# Patient Record
Sex: Male | Born: 1960 | Race: White | Hispanic: No | Marital: Single | Smoking: Never smoker
Health system: Southern US, Community
[De-identification: ages and names within clinical notes are randomized; demographics above are authoritative.]

## PROBLEM LIST (undated history)

## (undated) VITALS — BP 131/80 | HR 79 | Temp 98.0°F | Resp 20 | Ht 70.0 in | Wt 172.0 lb

## (undated) DIAGNOSIS — F32A Depression, unspecified: Secondary | ICD-10-CM

## (undated) DIAGNOSIS — F329 Major depressive disorder, single episode, unspecified: Secondary | ICD-10-CM

## (undated) DIAGNOSIS — M1990 Unspecified osteoarthritis, unspecified site: Secondary | ICD-10-CM

## (undated) HISTORY — DX: Unspecified osteoarthritis, unspecified site: M19.90

## (undated) HISTORY — DX: Depression, unspecified: F32.A

---
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
## 2000-09-20 ENCOUNTER — Emergency Department (HOSPITAL_COMMUNITY): Admission: EM | Admit: 2000-09-20 | Discharge: 2000-09-20 | Payer: Self-pay | Admitting: Internal Medicine

## 2008-03-03 ENCOUNTER — Ambulatory Visit: Payer: Self-pay | Admitting: Sports Medicine

## 2008-03-03 DIAGNOSIS — M545 Low back pain, unspecified: Secondary | ICD-10-CM | POA: Insufficient documentation

## 2008-03-03 DIAGNOSIS — M2020 Hallux rigidus, unspecified foot: Secondary | ICD-10-CM | POA: Insufficient documentation

## 2008-05-01 ENCOUNTER — Ambulatory Visit: Payer: Self-pay | Admitting: Sports Medicine

## 2008-05-15 ENCOUNTER — Ambulatory Visit: Payer: Self-pay | Admitting: Sports Medicine

## 2008-05-17 ENCOUNTER — Ambulatory Visit: Payer: Self-pay | Admitting: Family Medicine

## 2008-05-17 ENCOUNTER — Encounter: Payer: Self-pay | Admitting: Sports Medicine

## 2008-05-17 LAB — CONVERTED CEMR LAB
HDL: 62 mg/dL (ref >39)
LDL Cholesterol: 78 mg/dL (ref 0–99)
Total CHOL/HDL Ratio: 2.4
VLDL: 8 mg/dL (ref 0–40)

## 2012-01-29 ENCOUNTER — Ambulatory Visit (INDEPENDENT_AMBULATORY_CARE_PROVIDER_SITE_OTHER): Payer: BC Managed Care – PPO | Admitting: Family Medicine

## 2012-01-29 ENCOUNTER — Encounter: Payer: Self-pay | Admitting: Family Medicine

## 2012-01-29 VITALS — BP 128/73 | HR 57 | Temp 97.7°F | Resp 18 | Ht 70.0 in | Wt 172.0 lb

## 2012-01-29 DIAGNOSIS — Z125 Encounter for screening for malignant neoplasm of prostate: Secondary | ICD-10-CM

## 2012-01-29 DIAGNOSIS — Z0000 Encounter for general adult medical examination without abnormal findings: Secondary | ICD-10-CM

## 2012-01-29 DIAGNOSIS — Z1211 Encounter for screening for malignant neoplasm of colon: Secondary | ICD-10-CM

## 2012-01-29 DIAGNOSIS — Z13220 Encounter for screening for lipoid disorders: Secondary | ICD-10-CM

## 2012-01-29 DIAGNOSIS — Z23 Encounter for immunization: Secondary | ICD-10-CM

## 2012-01-29 LAB — POCT URINALYSIS DIPSTICK
Leukocytes, UA: NEGATIVE
Nitrite, UA: NEGATIVE
Protein, UA: NEGATIVE
Urobilinogen, UA: 0.2
pH, UA: 5

## 2012-01-29 LAB — IFOBT (OCCULT BLOOD): IFOBT: NEGATIVE

## 2012-01-29 NOTE — Patient Instructions (Addendum)
Keeping you healthy  Get these tests  Blood pressure- Have your blood pressure checked once a year by your healthcare provider.  Normal blood pressure is 120/80  Weight- Have your body mass index (BMI) calculated to screen for obesity.  BMI is a measure of body fat based on height and weight. You can also calculate your own BMI at [email or web address].  Cholesterol- Have your cholesterol checked every year.  Diabetes- Have your blood sugar checked regularly if you have high blood pressure, high cholesterol, have a family history of diabetes or if you are overweight.  Screening for Colon Cancer- Colonoscopy starting at age 53.  Screening may begin sooner depending on your family history and other health conditions. Follow up colonoscopy as directed by your Gastroenterologist.  Please collect the stool specimens as directed and return the Hemosures to our facility.  Screening for Prostate Cancer- Both blood work (PSA) and a rectal exam help screen for Prostate Cancer.  Screening begins at age 23 with African-American men and at age 23 with Caucasian men.  Screening may begin sooner depending on your family history.  Take these medicines  Aspirin- One aspirin daily can help prevent Heart disease and Stroke.  Flu shot- Every fall.  Tetanus- Every 10 years.  Today , you received Tdap- this is a 1-time dose.  Zostavax- Once after the age of 53 to prevent Shingles. The recommendation is to get this vaccine at age 53. You are getting an prescription for this to take to a local pharmacy where you will receive the vaccine.  Pneumonia shot- Once after the age of 53; if you are younger than 53, ask your healthcare provider if you need a Pneumonia shot.  Take these steps  Don't smoke- If you do smoke, talk to your doctor about quitting.  For tips on how to quit, go to www.smokefree.gov or call 1-800-QUIT-NOW.  Be physically active- Exercise 5 days a week for at least 30 minutes.  If you are  not already physically active start slow and gradually work up to 30 minutes of moderate physical activity.  Examples of moderate activity include walking briskly, mowing the yard, dancing, swimming, bicycling, etc.  Eat a healthy diet- Eat a variety of healthy food such as fruits, vegetables, low fat milk, low fat cheese, yogurt, lean meant, poultry, fish, beans, tofu, etc. For more information go to www.thenutritionsource.org  Drink alcohol in moderation- Limit alcohol intake to less than two drinks a day. Never drink and drive.  Dentist- Brush and floss twice daily; visit your dentist twice a year.  Depression- Your emotional health is as important as your physical health. If you're feeling down, or losing interest in things you would normally enjoy please talk to your healthcare provider.  Eye exam- Visit your eye doctor every year.  Safe sex- If you may be exposed to a sexually transmitted infection, use a condom.  Seat belts- Seat belts can save your life; always wear one.  Smoke/Carbon Monoxide detectors- These detectors need to be installed on the appropriate level of your home.  Replace batteries at least once a year.  Skin cancer- When out in the sun, cover up and use sunscreen 15 SPF or higher.  Violence- If anyone is threatening you, please tell your healthcare provider.  Living Will/ Health care power of attorney- Speak with your healthcare provider and family.     Testicular Problems and Self-Exam Men can examine themselves easily and effectively with positive results. Monthly exams detect problems early and  save lives. There are numerous causes of swelling in the testicle. Testicular cancer usually appears as a firm painless lump in the front part of the testicle. This may feel like a dull ache or heavy feeling located in the lower abdomen (belly), groin, or scrotum.  The risk is greater in men with undescended testicles and it is more common in young men. It is responsible  for almost a fifth of cancers in males between ages 42 and 25. Other common causes of swellings, lumps, and testicular pain include injuries, inflammation (soreness) from infection, hydrocele, and torsion. These are a few of the reasons to do monthly self-examination of the testicles. The exam only takes minutes and could add years to your life. Get in the habit! SELF-EXAMINATION OF THE TESTICLES The testicles are easiest to examine after warm baths or showers and are more difficult to examine when you are cold. This is because the muscles attached to the testicles retract and pull them up higher or into the abdomen. While standing, roll one testicle between the thumb and forefinger. Feel for lumps, swelling, or discomfort. A normal testicle is egg shaped and feels firm. It is smooth and not tender. The spermatic cord can be felt as a firm spaghetti-like cord at the back of the testicle. It is also important to examine your groins. This is the crease between the front of your leg and your abdomen. Also, feel for enlarged lymph nodes (glands). Enlarged nodes are also a cause for you to see your caregiver for evaluation.  Self-examination of the testicles and groin areas on a regular basis will help you to know what your own testicles and groins feel like. This will help you pick up an abnormality (difference) at an earlier stage. Early discovery is the key to curing this cancer or treating other conditions. Any lump, change, or swelling in the testicle calls for immediate evaluation by your caregiver. Cancer of the testicle does not result in impotence and it does not prevent normal intercourse or prevent having children. If your caregiver feels that medical treatment or chemotherapy could lead to infertility, sperm can be frozen for future use. It is necessary to see a caregiver as soon as possible after the discovery of a lump in a testicle. Document Released: 10/06/2000 Document Revised: 06/19/2011 Document  Reviewed: 07/01/2008 [facility] Patient Information 2012 [place], [place].

## 2012-01-30 LAB — LIPID PANEL
Cholesterol: 159 mg/dL (ref 0–200)
HDL: 79 mg/dL (ref >39)
Total CHOL/HDL Ratio: 2 Ratio
VLDL: 8 mg/dL (ref 0–40)

## 2012-01-30 LAB — COMPREHENSIVE METABOLIC PANEL
ALT: 29 U/L (ref 0–53)
AST: 26 U/L (ref 0–37)
BUN: 25 mg/dL — ABNORMAL HIGH (ref 6–23)
Calcium: 9.9 mg/dL (ref 8.4–10.5)
Chloride: 103 mEq/L (ref 96–112)
Creat: 1.14 mg/dL (ref 0.50–1.35)
Total Bilirubin: 0.7 mg/dL (ref 0.3–1.2)

## 2012-02-02 ENCOUNTER — Encounter: Payer: Self-pay | Admitting: Family Medicine

## 2012-02-02 NOTE — Progress Notes (Signed)
Subjective:    Patient ID: Robert Huff, male    DOB: 03/12/1961, 51 y.o.   MRN: [id number]  HPI  This 51 y.o. Cauc male is new to [facility] - here for CPE and health maintenance guidance.  His only supplement is Fish Oil capsules- 2 per day. He exercises regularly. He is employed as a IT trainer and  has a long term relationship.    Review of Systems Negative     Objective:   Physical Exam  Nursing note and vitals reviewed. Constitutional: He is oriented to person, place, and time. He appears well-developed and well-nourished. No distress.  HENT:  Head: Normocephalic and atraumatic.  Right Ear: Hearing, tympanic membrane, external ear and ear canal normal.  Left Ear: Hearing, tympanic membrane, external ear and ear canal normal.  Nose: Nose normal. No mucosal edema, nasal deformity or septal deviation.  Mouth/Throat: Uvula is midline, oropharynx is clear and moist and mucous membranes are normal. No oral lesions. Normal dentition. No dental caries. No posterior oropharyngeal erythema.  Eyes: Conjunctivae, EOM and lids are normal. Pupils are equal, round, and reactive to light. Right eye exhibits no discharge. Left eye exhibits no discharge. Right conjunctiva has no hemorrhage. Left conjunctiva has no hemorrhage. No scleral icterus.  Fundoscopic exam:      The right eye shows no arteriolar narrowing and no AV nicking. The right eye shows red reflex.      The left eye shows no arteriolar narrowing, no AV nicking and no papilledema. The left eye shows red reflex. Neck: Normal range of motion. Neck supple. No JVD present. No thyromegaly present.  Cardiovascular: Normal rate, regular rhythm, normal heart sounds and intact distal pulses.  Exam reveals no gallop and no friction rub.   No murmur heard. Pulmonary/Chest: Effort normal and breath sounds normal. No respiratory distress. He has no wheezes. He exhibits no tenderness.  Abdominal: Soft. Normal appearance and bowel sounds are normal. He exhibits  no distension, no abdominal bruit, no pulsatile midline mass and no mass. There is no hepatosplenomegaly. There is no tenderness. There is no guarding and no CVA tenderness. No hernia. Hernia confirmed negative in the ventral area, confirmed negative in the right inguinal area and confirmed negative in the left inguinal area.  Genitourinary: Rectum normal, prostate normal, testes normal and penis normal. Rectal exam shows no external hemorrhoid, no fissure, no mass and anal tone normal. Guaiac negative stool. Prostate is not tender. Right testis shows no mass, no swelling and no tenderness. Right testis is descended. Left testis shows no mass, no swelling and no tenderness. Left testis is descended.  Musculoskeletal: Normal range of motion. He exhibits no edema.  Lymphadenopathy:    He has no cervical adenopathy.       Right: No inguinal adenopathy present.       Left: No inguinal adenopathy present.  Neurological: He is alert and oriented to person, place, and time. He has normal reflexes. No cranial nerve deficit. He exhibits normal muscle tone. Coordination normal.  Skin: Skin is warm and dry. No rash noted. No pallor.  Psychiatric: He has a normal mood and affect. His behavior is normal. Judgment and thought content normal.          Assessment & Plan:   1. Routine general medical examination at a health care facility  POCT urinalysis dipstick, IFOBT POC (occult bld, rslt in office), Comprehensive metabolic panel  2. Screening for prostate cancer    3. Screening for hyperlipidemia  Lipid panel  4. Screening for colorectal cancer  IFOBT POC (occult bld, rslt in office)  5. Need for prophylactic vaccination with combined diphtheria-tetanus-pertussis (DTP) vaccine  Tdap vaccine greater than or equal to 7yo IM

## 2012-02-06 ENCOUNTER — Encounter: Payer: Self-pay | Admitting: *Deleted

## 2012-02-06 NOTE — Progress Notes (Signed)
Quick Note:  Please notify pt that results are normal.   Provide pt with copy of labs. ______ 

## 2012-02-10 LAB — IFOBT (OCCULT BLOOD): IFOBT: NEGATIVE

## 2014-01-11 ENCOUNTER — Encounter (INDEPENDENT_AMBULATORY_CARE_PROVIDER_SITE_OTHER): Payer: BC Managed Care – PPO | Admitting: Ophthalmology

## 2015-05-16 ENCOUNTER — Ambulatory Visit: Payer: Self-pay | Admitting: Sports Medicine

## 2015-06-04 ENCOUNTER — Encounter: Payer: Self-pay | Admitting: Family Medicine

## 2015-06-04 ENCOUNTER — Ambulatory Visit (INDEPENDENT_AMBULATORY_CARE_PROVIDER_SITE_OTHER): Payer: Self-pay | Admitting: Family Medicine

## 2015-06-04 VITALS — BP 125/76 | HR 65 | Ht 70.0 in | Wt 170.0 lb

## 2015-06-04 DIAGNOSIS — M25561 Pain in right knee: Secondary | ICD-10-CM

## 2015-06-04 NOTE — Patient Instructions (Addendum)
Your knee pain is most consistent with mild arthritis, less likely a degenerative lateral meniscus tear. Both are treated similarly. I'd avoid deep squats, deep lunges, leg press for now. Consider tylenol 500mg  1-2 tabs three times a day for pain. Ibuprofen 600mg  three times a day OR Aleve 2 tabs twice a day with food for 7-10 days then as needed. Capsaicin, aspercreme, or biofreeze topically up to four times a day may also help with pain. Cortisone injections are an option. It's important that you continue to stay active. Straight leg raises, knee extensions 3 sets of 10 once a day (add ankle weight if these become too easy). Consider physical therapy to strengthen muscles around the joint that hurts to take pressure off of the joint itself. Shoe inserts with good arch support may be helpful. Ice 15 minutes at a time 3-4 times a day as needed to help with pain. Think about dropping your usual exercises that have potential to cause pain to 50% (like rowing) and increase by about 10% per week. Follow up with me in 1 month.

## 2015-06-11 DIAGNOSIS — M25561 Pain in right knee: Secondary | ICD-10-CM | POA: Insufficient documentation

## 2015-06-11 NOTE — Assessment & Plan Note (Signed)
consistent with DJD, less likely degenerative lateral meniscus tear.  Discussed tylenol, nsaids, topical medications.  Consider cortisone injection.  Shown home exercises to do daily.  Arch supports, icing.  Discussed relative rest and slowly increasing workout activities.  F/u in 1 month.

## 2015-06-11 NOTE — Progress Notes (Signed)
PCP: No primary care provider on file.  Subjective:   HPI: Patient is a 54 y.o. male here for right knee pain.  Patient reports he's had a little right knee pain for about 5 months. Came back quickly though 2 months ago when jumping rope (around 10/1). Pain is deep, lateral, sharp. Associated with some weakness of this leg. Worse with getting up from prolonged sitting. Pain 2/10 but up to 6/10 with certain activities. No skin changes, fever, other complaints.  No past medical history on file.  No current outpatient prescriptions on file prior to visit.   No current facility-administered medications on file prior to visit.    No past surgical history on file.  No Known Allergies  Social History   Social History  . Marital Status: Single    Spouse Name: N/A  . Number of Children: N/A  . Years of Education: N/A   Occupational History  . Not on file.   Social History Main Topics  . Smoking status: Never Smoker   . Smokeless tobacco: Not on file  . Alcohol Use: 0.6 - 1.2 oz/week    1-2 Standard drinks or equivalent per week  . Drug Use: No  . Sexual Activity: Yes   Other Topics Concern  . Not on file   Social History Narrative    Family History  Problem Relation Age of Onset  . Cancer Mother     Breast cancer  . Cancer Father     Lung cancer  . Gout Brother   . Heart disease Maternal Grandmother   . Early death Maternal Grandfather   . Gout Brother   . Gout Brother   . Gout Brother     BP 125/76 mmHg  Pulse 65  Ht 5\' 10"  (1.778 m)  Wt 170 lb (77.111 kg)  BMI 24.39 kg/m2  Review of Systems: See HPI above.    Objective:  Physical Exam:  Gen: NAD  Right knee: No gross deformity, ecchymoses, effusion. TTP lateral > medial joint line. FROM. Negative ant/post drawers. Negative valgus/varus testing. Negative lachmanns. Negative mcmurrays, apleys, patellar apprehension. NV intact distally.  Left knee: FROM without pain.    Assessment &  Plan:  1. Right knee pain - consistent with DJD, less likely degenerative lateral meniscus tear.  Discussed tylenol, nsaids, topical medications.  Consider cortisone injection.  Shown home exercises to do daily.  Arch supports, icing.  Discussed relative rest and slowly increasing workout activities.  F/u in 1 month.

## 2015-07-03 ENCOUNTER — Ambulatory Visit (INDEPENDENT_AMBULATORY_CARE_PROVIDER_SITE_OTHER): Payer: BLUE CROSS/BLUE SHIELD | Admitting: Family Medicine

## 2015-07-03 ENCOUNTER — Encounter: Payer: Self-pay | Admitting: Family Medicine

## 2015-07-03 VITALS — BP 106/47 | HR 56 | Ht 70.0 in | Wt 170.0 lb

## 2015-07-03 DIAGNOSIS — M25561 Pain in right knee: Secondary | ICD-10-CM

## 2015-07-04 NOTE — Progress Notes (Signed)
PCP: No primary care provider on file.  Subjective:   HPI: Patient is a 54 y.o. male here for right knee pain.  11/21: Patient reports he's had a little right knee pain for about 5 months. Came back quickly though 2 months ago when jumping rope (around 10/1). Pain is deep, lateral, sharp. Associated with some weakness of this leg. Worse with getting up from prolonged sitting. Pain 2/10 but up to 6/10 with certain activities. No skin changes, fever, other complaints.  12/20: Patient reports he is about 70% improved from last visit. Able to work out though has avoided much running, not doing any jumping activities. Ok with lunges. Pain at most 3/10 level. 0/10 pain now. Doing home exercises. Used a little biofreeze, capsaicin. Not taking any medicines. No skin changes, fever, other complaints.  No past medical history on file.  No current outpatient prescriptions on file prior to visit.   No current facility-administered medications on file prior to visit.    No past surgical history on file.  No Known Allergies  Social History   Social History  . Marital Status: Single    Spouse Name: N/A  . Number of Children: N/A  . Years of Education: N/A   Occupational History  . Not on file.   Social History Main Topics  . Smoking status: Never Smoker   . Smokeless tobacco: Not on file  . Alcohol Use: 0.6 - 1.2 oz/week    1-2 Standard drinks or equivalent per week  . Drug Use: No  . Sexual Activity: Yes   Other Topics Concern  . Not on file   Social History Narrative    Family History  Problem Relation Age of Onset  . Cancer Mother     Breast cancer  . Cancer Father     Lung cancer  . Gout Brother   . Heart disease Maternal Grandmother   . Early death Maternal Grandfather   . Gout Brother   . Gout Brother   . Gout Brother     BP 106/47 mmHg  Pulse 56  Ht 5\' 10"  (1.778 m)  Wt 170 lb (77.111 kg)  BMI 24.39 kg/m2  Review of Systems: See HPI above.    Objective:  Physical Exam:  Gen: NAD  Right knee: No gross deformity, ecchymoses, effusion. No longer with TTP lateral > medial joint line. FROM. Negative ant/post drawers. Negative valgus/varus testing. Negative lachmanns. Negative mcmurrays, apleys, patellar apprehension. NV intact distally.  Left knee: FROM without pain.    Assessment & Plan:  1. Right knee pain - consistent with DJD, less likely degenerative lateral meniscus tear.  Improving with home exercise program.  Continue with this.  Again discussed tylenol, nsaids, topical medications.  Consider cortisone injection, physical therapy if he worsens.  Otherwise f/u prn.

## 2015-07-04 NOTE — Assessment & Plan Note (Signed)
consistent with DJD, less likely degenerative lateral meniscus tear.  Improving with home exercise program.  Continue with this.  Again discussed tylenol, nsaids, topical medications.  Consider cortisone injection, physical therapy if he worsens.  Otherwise f/u prn.

## 2016-05-24 ENCOUNTER — Encounter (HOSPITAL_COMMUNITY): Payer: Self-pay | Admitting: Nurse Practitioner

## 2016-05-24 ENCOUNTER — Emergency Department (HOSPITAL_COMMUNITY)
Admission: EM | Admit: 2016-05-24 | Discharge: 2016-05-24 | Disposition: A | Discharge status: Home / Self Care | Payer: BLUE CROSS/BLUE SHIELD | Attending: Emergency Medicine | Admitting: Emergency Medicine

## 2016-05-24 DIAGNOSIS — Z1211 Encounter for screening for malignant neoplasm of colon: Secondary | ICD-10-CM | POA: Diagnosis not present

## 2016-05-24 DIAGNOSIS — Z139 Encounter for screening, unspecified: Secondary | ICD-10-CM

## 2016-05-24 DIAGNOSIS — Z0189 Encounter for other specified special examinations: Secondary | ICD-10-CM | POA: Diagnosis not present

## 2016-05-24 NOTE — ED Triage Notes (Signed)
Pt has no complaints whatsoever. He states his insurance, [employer] has been "pestering him about getting him a colon cancer screening." He adds that the 3 options they gave him was for 1. Fecal Occult test 2. Sigmoidoscopy and 3. Colonoscopy. Endorses immediate family hx of lung ca( maternal father) and BRCA (maternal mother.

## 2016-05-24 NOTE — ED Provider Notes (Signed)
[facility] DEPT Provider Note   CSN: [id number] Arrival date & time: 05/24/16  1553     History   Chief Complaint Chief Complaint  Patient presents with  . Requesting Colon CA Screening    HPI Robert Huff is a 55 y.o. male.  The history is provided by the patient and medical records.    55 year old male presenting here requesting a colon cancer screening card kit. Patient states he last completed this 5 years ago. He states he was given the kit in urgent care office and to get back. He states he was never informed of the results so he assumes they were normal. He states he has [employer] and they have been calling him repetitively informing him that he is due for colon cancer screening. He has no complaints, specifically no abdominal pain, change in bowel habits, fever, chills, nausea, or vomiting. States he does not have a primary care doctor at this time. He has never had a colonoscopy.  History reviewed. No pertinent past medical history.  Patient Active Problem List   Diagnosis Date Noted  . Right knee pain 06/11/2015  . LOW BACK PAIN, CHRONIC 03/03/2008  . HALLUX RIGIDUS 03/03/2008    History reviewed. No pertinent surgical history.     Home Medications    Prior to Admission medications   Not on File    Family History Family History  Problem Relation Age of Onset  . Cancer Mother     Breast cancer  . Cancer Father     Lung cancer  . Gout Brother   . Heart disease Maternal Grandmother   . Early death Maternal Grandfather   . Gout Brother   . Gout Brother   . Gout Brother     Social History Social History  Substance Use Topics  . Smoking status: Never Smoker  . Smokeless tobacco: Never Used  . Alcohol use 0.6 - 1.2 oz/week    1 - 2 Standard drinks or equivalent per week     Allergies   Patient has no known allergies.   Review of Systems Review of Systems  Constitutional: Negative for chills.  Respiratory:  Negative for shortness of breath.   Gastrointestinal: Negative for abdominal pain, constipation, diarrhea, nausea and vomiting.  All other systems reviewed and are negative.    Physical Exam Updated Vital Signs BP 127/79 (BP Location: Right Arm)   Pulse (!) 55   Temp 97.6 F (36.4 C) (Oral)   Ht 5' 10"  (1.778 m)   Wt 76.2 kg   SpO2 100%   BMI 24.11 kg/m   Physical Exam  Constitutional: He is oriented to person, place, and time. He appears well-developed and well-nourished.  HENT:  Head: Normocephalic and atraumatic.  Mouth/Throat: Oropharynx is clear and moist.  Eyes: Conjunctivae and EOM are normal. Pupils are equal, round, and reactive to light.  Neck: Normal range of motion.  Cardiovascular: Normal rate, regular rhythm and normal heart sounds.   Pulmonary/Chest: Effort normal and breath sounds normal.  Abdominal: Soft. Bowel sounds are normal. There is no tenderness. There is no rebound.  Musculoskeletal: Normal range of motion.  Neurological: He is alert and oriented to person, place, and time.  Skin: Skin is warm and dry.  Psychiatric: He has a normal mood and affect.  Nursing note and vitals reviewed.    ED Treatments / Results  Labs (all labs ordered are listed, but only abnormal results are displayed) Labs Reviewed - No data  to display  EKG  EKG Interpretation None       Radiology No results found.  Procedures Procedures (including critical care time)  Medications Ordered in ED Medications - No data to display   Initial Impression / Assessment and Plan / ED Course  I have reviewed the triage vital signs and the nursing notes.  Pertinent labs & imaging results that were available during my care of the patient were reviewed by me and considered in my medical decision making (see chart for details).  Clinical Course    55 year old male here requesting colon cancer screening card kit. He has no complaints, specifically no abdominal pain, nausea,  vomiting or diarrhea. Long discussion with patient that this is not a feasible option here in the emergency department. I have recommended that you follow-up with a GI doctor as he is 30 and is technically due for a colonoscopy. He is somewhat aggressive and upset when I discussed this with him and states he does not understand why we can't just give him the kit.  I have again reiterated to him that we do not have those here in the ED.  He remains upset stating he came here at this time because it was convenient for him as he works Monday-Friday.  I have recommended that he follow-up with an urgent care that is capable of doing primary care screenings if he needs to see someone on the weekend.  He was given information for local urgent care offices as well as GI physician on call.  Discussed plan with patient, he acknowledged understanding and agreed with plan of care.  Return precautions given for new or worsening symptoms.  Final Clinical Impressions(s) / ED Diagnoses   Final diagnoses:  Encounter for medical screening examination    New Prescriptions New Prescriptions   No medications on file     Larene Pickett, PA-C 05/24/16 1647    Nat Christen, MD 05/25/16 [phone]

## 2016-05-24 NOTE — Discharge Instructions (Signed)
Follow-up with one of the urgent care offices that can maybe get the colon cancer screening card for you. May follow-up with GI to have this done for you--see their info

## 2016-05-31 ENCOUNTER — Ambulatory Visit (INDEPENDENT_AMBULATORY_CARE_PROVIDER_SITE_OTHER): Payer: BLUE CROSS/BLUE SHIELD | Admitting: Family Medicine

## 2016-05-31 VITALS — BP 110/60 | HR 63 | Temp 98.0°F | Resp 17 | Ht 69.25 in | Wt 170.0 lb

## 2016-05-31 DIAGNOSIS — Z8481 Family history of carrier of genetic disease: Secondary | ICD-10-CM | POA: Diagnosis not present

## 2016-05-31 DIAGNOSIS — Z1211 Encounter for screening for malignant neoplasm of colon: Secondary | ICD-10-CM

## 2016-05-31 LAB — PSA: PSA: 0.4 ng/mL (ref <=4.0)

## 2016-05-31 NOTE — Patient Instructions (Addendum)
     IF you received an x-ray today, you will receive an invoice from [facility]. Please contact [facility] at [phone] with questions or concerns regarding your invoice.   IF you received labwork today, you will receive an invoice from [facility]. Please contact [facility] at [phone] with questions or concerns regarding your invoice.   Our billing staff will not be able to assist you with questions regarding bills from these companies.  You will be contacted with the lab results as soon as they are available. The fastest way to get your results is to activate your My Chart account. Instructions are located on the last page of this paperwork. If you have not heard from us regarding the results in 2 weeks, please contact this office.      Colorectal Cancer Screening Colorectal cancer screening is a group of tests used to check for colorectal cancer. Colorectal refers to your colon and rectum. Your colon and rectum are located at the end of your large intestine and carry your bowel movements out of your body. Why is colorectal cancer screening done? It is common for abnormal growths (polyps) to form in the lining of your colon, especially as you get older. These polyps can be cancerous or become cancerous. If colorectal cancer is found at an early stage, it is treatable. Who should be screened for colorectal cancer? Screening is recommended for all adults at average risk starting at age 50. Tests may be recommended every 1 to 10 years. Your health care provider may recommend earlier or more frequent screening if you have:  A history of colorectal cancer or polyps.  A family member with a history of colorectal cancer or polyps.  Inflammatory bowel disease, such as ulcerative colitis or Crohn disease.  A type of hereditary colon cancer syndrome.  Colorectal cancer symptoms. Types of screening tests There are several types of colorectal  screening tests. They include:  Guaiac-based fecal occult blood testing.  Fecal immunochemical test (FIT).  Stool DNA test.  Barium enema.  Virtual colonoscopy.  Sigmoidoscopy. During this test, a sigmoidoscope is used to examine your rectum and lower colon. A sigmoidoscope is a flexible tube with a camera that is inserted through your anus into your rectum and lower colon.  Colonoscopy. During this test, a colonoscope is used to examine your entire colon. A colonoscope is a long, thin, flexible tube with a camera. This test examines your entire colon and rectum. This information is not intended to replace advice given to you by your health care provider. Make sure you discuss any questions you have with your health care provider. Document Released: 12/18/2009 Document Revised: 02/07/2016 Document Reviewed: 10/06/2013 Elsevier Interactive Patient Education  2017 Elsevier Inc.   

## 2016-05-31 NOTE — Progress Notes (Signed)
  Chief Complaint  Patient presents with  . Colon Cancer Screening    Per [employer]    HPI   Pt reports that he exercises regularly and is feeling well. States that he has been notified by insurance that he needs colon cancer screening Reports that he has been feeling well without unexplained weight loss or blood in bowel movements He did a FOBT in 2013 that was negative.   Reports that his mother was BRCA positive.  He has not had a psa testing. He reports that he does not have any concerns passing urine.      No past medical history on file.  No current outpatient prescriptions on file.   No current facility-administered medications for this visit.     Allergies: No Known Allergies  No past surgical history on file.  Social History   Social History  . Marital status: Single    Spouse name: N/A  . Number of children: N/A  . Years of education: N/A   Social History Main Topics  . Smoking status: Never Smoker  . Smokeless tobacco: Never Used  . Alcohol use 0.6 - 1.2 oz/week    1 - 2 Standard drinks or equivalent per week  . Drug use: No  . Sexual activity: Yes   Other Topics Concern  . None   Social History Narrative  . None    ROS  Objective: Vitals:   05/31/16 1452  BP: 110/60  Pulse: 63  Resp: 17  Temp: 98 F (36.7 C)  TempSrc: Oral  SpO2: 97%  Weight: 170 lb (77.1 kg)  Height: 5' 9.25" (1.759 m)   Body mass index is 24.92 kg/m.  Physical Exam  Constitutional: He is oriented to person, place, and time. He appears well-developed and well-nourished.  HENT:  Head: Normocephalic and atraumatic.  Eyes: Conjunctivae and EOM are normal.  Cardiovascular: Normal rate, regular rhythm and normal heart sounds.   Pulmonary/Chest: Effort normal and breath sounds normal. No respiratory distress. He has no wheezes.  Neurological: He is alert and oriented to person, place, and time.    Assessment and Plan Robert Huff was seen today for colon cancer  screening.  Diagnoses and all orders for this visit:  Screening for colon cancer- declined colonoscopy Agreed to fobt -     POC Hemoccult Bld/Stl (3-Cd Home Screen); Future  Family history of BRCA1 gene positive- discussed risk factors and pt elects to do psa -     [place]

## 2018-02-06 DIAGNOSIS — M7150 Other bursitis, not elsewhere classified, unspecified site: Secondary | ICD-10-CM | POA: Diagnosis not present

## 2018-02-09 ENCOUNTER — Ambulatory Visit: Payer: Self-pay

## 2018-02-09 ENCOUNTER — Encounter: Payer: Self-pay | Admitting: Sports Medicine

## 2018-02-09 ENCOUNTER — Ambulatory Visit (INDEPENDENT_AMBULATORY_CARE_PROVIDER_SITE_OTHER): Payer: BLUE CROSS/BLUE SHIELD | Admitting: Sports Medicine

## 2018-02-09 ENCOUNTER — Encounter

## 2018-02-09 VITALS — BP 114/78 | Ht 70.0 in | Wt 170.0 lb

## 2018-02-09 DIAGNOSIS — M25561 Pain in right knee: Secondary | ICD-10-CM | POA: Diagnosis not present

## 2018-02-09 DIAGNOSIS — S76109A Unspecified injury of unspecified quadriceps muscle, fascia and tendon, initial encounter: Secondary | ICD-10-CM | POA: Diagnosis not present

## 2018-02-09 NOTE — Progress Notes (Signed)
HPI  CC: Right knee swelling  [name] is a 57 year old male with no notable past medical history who presents for right knee swelling.  He states he first noticed the swelling 3 and half weeks ago following doing deep squats the day before.  He states he woke up and had swelling in his right knee.  He states he had mild pain in his knee, but nothing out of the ordinary for typical pain he has in the area.  Does Not remember any pops during the initial time period.  He does not remember any specific trauma to the area.  The knee has not been locking on him.  He states the swelling has been consistent since that time of initial injury.  He states when he runs and jumps it makes the pain worse.  He states when he is prolonged standing makes the pain worse.  He has not required any medication to try to make it better.  He states he has not tried any over-the-counter NSAIDs.  He did apply some ice to area with minimal effect.  He is tried a compression knee sleeve over the knee, without any improvement.  He denies any numbness and tingling in the leg.  He denies any weakness in the leg.  He denies any shooting pain to other parts of his body.  He is continued to do squats without any pain or decreased range of motion.  All past medical history, medications, allergies reviewed by myself at today's visit.  Past Injuries: He reports previous meniscal injury 4 and half years ago in the same knee. Past Surgeries: Denies Smoking: Denies Family Hx: Noncontributory to current problem  ROS: Per HPI; in addition no fever, no rash, no additional weakness, no additional numbness, no additional paresthesias, and no additional falls/injury.   Objective: BP 114/78   Ht 5\' 10"  (1.778 m)   Wt 170 lb (77.1 kg)   BMI 24.39 kg/m  Gen: NAD, well groomed, a/o x3, normal affect.  CV: Well-perfused. Warm.  Resp: Non-labored.  Neuro: Sensation intact throughout. No gross coordination deficits.  Gait: Nonpathologic  posture, unremarkable stride without signs of limp or balance issues.  Right knee exam: No erythema, warmth, or rash noted over affected area.  There is a large effusion noted superiorly to the patella.  Is no tenderness to palpation.  Patient has full range of motion both passive and active.  5 out of 5 strength throughout.  Negative Lockman, negative posterior drawer, negative valgus/varus testing, negative McMurray.  Patient is neurovascularly intact.  ULTRASOUND: Knee, right Diagnostic limited ultrasound imaging obtained of patient's right knee.  - Quadriceps tendon: Area of hyperechogenicity over distal tendon proximal to tendon insertion site of the rectus femoris.  No tears or calcifications noted in the insertion site of the VMO or vastus lateralis.  sizable amount of (compressible) fluid/edema superior to the quad tendon.  Mild amount of fluid noted within the suprapatellar pouch.  - Patellar tendon: No appreciated signs of tearing, edema, or calcification. No infrapatellar or tibial tuberosity fluid or abnormality appreciated.  Meniscus looks good bilaterally IMPRESSION: findings consistent with partial tear of the rectus femoris tendon, with associated seroma.  Ultrasound and interpretation by Alric Quan, MD and Sibyl Parr. Arleatha Philipps, MD   Assessment and Plan: Right knee swelling, with signs of rectus femoris partial tear on ultrasound, with associated seroma.  Patient is currently without symptoms, and able to continue with his current right workout regimen with minimal deficit.  We  advised him to stop all deep squats and exercises that would stretch but strain on his quad tendon at this time.  He may continue with straight leg extensions and leg press machine at this time.  We have provided him with eccentric quad exercises as well.  We advised that he start using an Ace wrap over this affected cerumen to try to reduce the swelling.  He should try to ice the knee 1-2 times daily.  We will  also obtain standing view x-rays today to evaluate the knee joint space.  Return to clinic in 4 weeks to reevaluate swelling.   Alric QuanBlake Dixon, MD [facility] Fellow 02/09/2018 1:43 PM   I observed and examined the patient with the [facility] and agree with assessment and plan.  Note reviewed and modified by me. Sterling BigKB Jada Kuhnert, MD

## 2018-03-02 ENCOUNTER — Ambulatory Visit: Payer: BLUE CROSS/BLUE SHIELD | Admitting: Sports Medicine

## 2018-03-18 ENCOUNTER — Ambulatory Visit
Admission: RE | Admit: 2018-03-18 | Discharge: 2018-03-18 | Disposition: A | Discharge status: Home / Self Care | Payer: BLUE CROSS/BLUE SHIELD | Source: Ambulatory Visit | Attending: Sports Medicine | Admitting: Sports Medicine

## 2018-03-18 DIAGNOSIS — M25561 Pain in right knee: Secondary | ICD-10-CM

## 2018-03-18 DIAGNOSIS — M1712 Unilateral primary osteoarthritis, left knee: Secondary | ICD-10-CM | POA: Diagnosis not present

## 2018-03-18 DIAGNOSIS — M1711 Unilateral primary osteoarthritis, right knee: Secondary | ICD-10-CM | POA: Diagnosis not present

## 2018-03-23 ENCOUNTER — Ambulatory Visit (INDEPENDENT_AMBULATORY_CARE_PROVIDER_SITE_OTHER): Payer: BLUE CROSS/BLUE SHIELD | Admitting: Sports Medicine

## 2018-03-23 ENCOUNTER — Encounter: Payer: Self-pay | Admitting: Sports Medicine

## 2018-03-23 DIAGNOSIS — M25561 Pain in right knee: Secondary | ICD-10-CM

## 2018-03-23 NOTE — Assessment & Plan Note (Addendum)
S/p recent partial tear of rectus femoris tendon with seroma, now resorbed and swelling has improved remarkably. XR reviewed and appears unremarkable.  He complains of new tenderness over patellar tendon.   Ultrasound performed in office which reveals intact patellar tendon, symptoms likely 2/2 activity modification and increase in lunges/deep squats from his normal.  -Advised him to avoid deep squatting beyond 45 degrees and lunges beyond 90 deg to avoid overloading the knee -recommend ice massage, may use compression sleeve for support

## 2018-03-23 NOTE — Progress Notes (Addendum)
   Subjective:   Patient ID: Robert Huff    DOB: July 31, 1960, 57 y.o. male   MRN: [id number]  CC: f/u right knee  HPI: Robert Huff is a 57 y.o. male who presents to clinic today for the following issue.  Right knee pain Seen on 7/30 and found to have a partial tear of rectus femoris tendon with associated seroma on ultrasound.  Swelling has since improved with applying ice and is now minimal.  He has noticed new pain along his patellar tendon on the same side ever since swelling has gone down.  He reports he has stopped running and doing activities that involve jumping due to the tendon tear and has instead been focusing on lunges and squats which may have exacerbated his knee pain.  He attends body pump classes 3-5 times a week.  Has not exercised this weekend as he has tried to rest it to see if that provides him any relief.   Denies numbness, tingling, weakness.  No locking, catching or clicking.  He has not tried medications.  He would also like to review x-rays obtained earlier last week.    ROS: No warmth, swelling.  No numbness, tingling.   PMFSH: Pertinent past medical, surgical, family, and social history were reviewed and updated as appropriate. Smoking status reviewed. Medications reviewed. Objective:   BP 108/78   Ht 5\' 10"  (1.778 m)   Wt 168 lb (76.2 kg)   BMI 24.11 kg/m  Vitals and nursing note reviewed.  General: 57 yo male, NAD   Right knee: Normal to inspection with no erythema or effusion or obvious bony abnormalities.  Palpation normal without warmth or joint line tenderness.  +TTP over patellar tendon.  ROM normal in flexion and extension and lower leg rotation. Ligaments with solid consistent endpoints.  Negative Mcmurray's and provocative meniscal tests. Non painful patellar compression. Patellar and quadriceps tendons unremarkable. Hamstring and quadriceps strength is normal. Normal sensation.   Skin: warm, dry, no rash Neuro: alert, oriented x3, no focal  deficits, reflexes intact, normal gait.   Ultrasound- Intact patellar tendon, there is increased doppler activity along the peritenon but no neovessels.  mild swelling noted in suprapatellar pouch but this is same as on left knee.  Soft tissue seroma and swelling along vastus lateralis tendon is reduced dramatically from last visit.  Otherwise normal exam.   Ultrasound and interpretation by Sibyl Parr. Fields, MD  XR reviewed and I do not think he has clinically significant DJD although read as some evidence for this. KBF  Assessment & Plan:   Right knee pain S/p recent partial tear of rectus femoris tendon with seroma, now resorbed and swelling has improved remarkably. XR reviewed and appears unremarkable.  He complains of new tenderness over patellar tendon.   Ultrasound performed in office which reveals intact patellar tendon, symptoms likely 2/2 activity modification and increase in lunges/deep squats from his normal.  -Advised him to avoid deep squatting beyond 45 degrees and lunges beyond 90 deg to avoid overloading the knee -recommend ice massage, may use compression sleeve for support   Freddrick March, MD [facility] PGY-3  I observed and examined the patient with the resident and agree with assessment and plan.  Note reviewed and modified by me. Sterling Big, MD

## 2018-07-04 DIAGNOSIS — Z1212 Encounter for screening for malignant neoplasm of rectum: Secondary | ICD-10-CM | POA: Diagnosis not present

## 2018-07-04 DIAGNOSIS — Z1211 Encounter for screening for malignant neoplasm of colon: Secondary | ICD-10-CM | POA: Diagnosis not present

## 2018-07-05 ENCOUNTER — Encounter

## 2018-07-05 ENCOUNTER — Ambulatory Visit: Payer: Self-pay

## 2018-07-05 ENCOUNTER — Encounter: Payer: Self-pay | Admitting: Sports Medicine

## 2018-07-05 ENCOUNTER — Ambulatory Visit (INDEPENDENT_AMBULATORY_CARE_PROVIDER_SITE_OTHER): Payer: BLUE CROSS/BLUE SHIELD | Admitting: Sports Medicine

## 2018-07-05 VITALS — BP 134/79 | Ht 70.0 in | Wt 170.0 lb

## 2018-07-05 DIAGNOSIS — G8929 Other chronic pain: Secondary | ICD-10-CM

## 2018-07-05 DIAGNOSIS — M25561 Pain in right knee: Secondary | ICD-10-CM

## 2018-07-05 DIAGNOSIS — M7042 Prepatellar bursitis, left knee: Secondary | ICD-10-CM | POA: Insufficient documentation

## 2018-07-05 DIAGNOSIS — M25562 Pain in left knee: Secondary | ICD-10-CM

## 2018-07-05 NOTE — Assessment & Plan Note (Signed)
No real pain today Wants to continue with compression Icing  Warned about infection  Avoid deep squats

## 2018-07-05 NOTE — Progress Notes (Signed)
CC; RT knee pain  RT knee pain seen 7/30 Partial Rect Femoris tear and seroma RT knee did well  Pain over RT tibial tubercle was present then Went away Came back 2 weeks ago after dead lifts  Since October has had a lot of swelling in left knee No specific injury but was doing heavy lifts and squats  Fam Hx: 3 brothers with gout 1 brother without gout Dad died age 59 lung cancer Mom [id number] - some LBP/ breast cancer survivor  ROS No locking No giving way  PE Muscular W M in NAD/ large swelling on left anterior knee BP 134/79   Ht 5\' 10"  (1.778 m)   Wt 170 lb (77.1 kg)   BMI 24.39 kg/m   Knee: RT and Left Right ormal to inspection with no erythema or effusion or obvious bony abnormalities. Palpation normal with no warmth or joint line tenderness or patellar tenderness or condyle tenderness. There is localized TTP at tibial tubercle ROM normal in flexion and extension and lower leg rotation. Ligaments with solid consistent endpoints including ACL, PCL, LCL, MCL. Negative Mcmurray's and provocative meniscal tests. Non painful patellar compression. Patellar and quadriceps tendons unremarkable. Hamstring and quadriceps strength is normal.  On left knee a marked swelling that is soft non tender the size of a baseball over the left patella. Freely moveable.  Ultrasound of Knees  Right knee shows mils swelling in suprapatellar pouch The old seroma has resolved and left some soft tissue calcification over soft tissue just superior to patella Meniscus medially and laterally unremarkable Quad tendon intact patellar tendon shows resolution of hypoechoic change noted before  Left knee Small amount of swelling in suprapatellar pouch Both quadriceps and patellar tendons look intact Large seroma Starts at superior patella and extends below distal patella Painful area at tibial tubercle is normal in appearance  Impression:  Soft tissue seroma of right knee resolved; large soft  tissue seroma of left knee.  No signs of tendinopathy noted.  Ultrasound and interpretation by Sibyl ParrKarl B. Darrick PennaFields, MD

## 2018-07-05 NOTE — Assessment & Plan Note (Signed)
Large prepatellar bursitis has resolved  Tendon pain but no obvious tendinopathy on US or exam  Will modify lifting Ice compression

## 2018-07-15 ENCOUNTER — Ambulatory Visit: Payer: BLUE CROSS/BLUE SHIELD | Admitting: Sports Medicine

## 2019-01-25 DIAGNOSIS — Z03818 Encounter for observation for suspected exposure to other biological agents ruled out: Secondary | ICD-10-CM | POA: Diagnosis not present

## 2019-01-31 DIAGNOSIS — Z03818 Encounter for observation for suspected exposure to other biological agents ruled out: Secondary | ICD-10-CM | POA: Diagnosis not present

## 2019-02-01 DIAGNOSIS — M25561 Pain in right knee: Secondary | ICD-10-CM | POA: Diagnosis not present

## 2019-02-01 DIAGNOSIS — U071 COVID-19: Secondary | ICD-10-CM | POA: Diagnosis not present

## 2019-02-28 ENCOUNTER — Ambulatory Visit (INDEPENDENT_AMBULATORY_CARE_PROVIDER_SITE_OTHER): Payer: BC Managed Care – PPO | Admitting: Family Medicine

## 2019-02-28 ENCOUNTER — Other Ambulatory Visit: Payer: Self-pay

## 2019-02-28 VITALS — BP 118/70 | Ht 70.0 in | Wt 170.0 lb

## 2019-02-28 DIAGNOSIS — M25561 Pain in right knee: Secondary | ICD-10-CM | POA: Diagnosis not present

## 2019-02-28 MED ORDER — DICLOFENAC SODIUM 75 MG PO TBEC: 75.0000 mg | DELAYED_RELEASE_TABLET | Freq: Two times a day (BID) | ORAL | 1 refills | Status: DC

## 2019-02-28 MED FILL — DICLOFENAC SODIUM 75 MG TAB: 75 | 30 days supply | Qty: 60 | Fill #0

## 2019-02-28 NOTE — Progress Notes (Signed)
PCP: Patient, No Pcp Per  Subjective:   HPI: Patient is a 58 y.o. male here for right shin pain.  07/05/18: RT knee pain seen 7/30 Partial Rect Femoris tear and seroma RT knee did well  Pain over RT tibial tubercle was present then Went away Came back 2 weeks ago after dead lifts  Since 05/07/2023 has had a lot of swelling in left knee No specific injury but was doing heavy lifts and squats  02/28/19: Patient reports he's continued to struggle with right proximal tibia pain since last visit. He's continued to try to exercise but especially past 5 days pain has become so severe even with weight bearing. No pain at joint lines or in area of patellar tendon. Pain currently 3/10 and a soreness but much worse with weight bearing. Radiographs were negative. No skin changes, numbness.  History reviewed. No pertinent past medical history.  No current outpatient medications on file prior to visit.   No current facility-administered medications on file prior to visit.     History reviewed. No pertinent surgical history.  No Known Allergies  Social History   Socioeconomic History  . Marital status: Single    Spouse name: Not on file  . Number of children: Not on file  . Years of education: Not on file  . Highest education level: Not on file  Occupational History  . Not on file  Social Needs  . Financial resource strain: Not on file  . Food insecurity    Worry: Not on file    Inability: Not on file  . Transportation needs    Medical: Not on file    Non-medical: Not on file  Tobacco Use  . Smoking status: Never Smoker  . Smokeless tobacco: Never Used  Substance and Sexual Activity  . Alcohol use: Yes    Alcohol/week: 1.0 - 2.0 standard drinks    Types: 1 - 2 Standard drinks or equivalent per week  . Drug use: No  . Sexual activity: Yes  Lifestyle  . Physical activity    Days per week: Not on file    Minutes per session: Not on file  . Stress: Not on file   Relationships  . Social Herbalist on phone: Not on file    Gets together: Not on file    Attends religious service: Not on file    Active member of club or organization: Not on file    Attends meetings of clubs or organizations: Not on file    Relationship status: Not on file  . Intimate partner violence    Fear of current or ex partner: Not on file    Emotionally abused: Not on file    Physically abused: Not on file    Forced sexual activity: Not on file  Other Topics Concern  . Not on file  Social History Narrative  . Not on file    Family History  Problem Relation Age of Onset  . Cancer Mother        Breast cancer  . Cancer Father        Lung cancer  . Gout Brother   . Heart disease Maternal Grandmother   . Early death Maternal Grandfather   . Gout Brother   . Gout Brother   . Gout Brother     BP 118/70   Ht 5\' 10"  (1.778 m)   Wt 170 lb (77.1 kg)   BMI 24.39 kg/m   Review of Systems: See  HPI above.     Objective:  Physical Exam:  Gen: NAD, comfortable in exam room  Right knee: No gross deformity, ecchymoses, effusion. No TTP - localizes pain to about 2 cm distal to tibial tubercle. FROM with 5/5 strength flexion and extension without pain. Negative ant/post drawers. Negative valgus/varus testing. Negative lachmans. Negative mcmurrays, apleys, patellar apprehension. NV intact distally. Positive hop test.  Assessment & Plan:  1. Right proximal tibia pain - in physically active patient with positive hop test.  Concern for proximal tibia stress fracture/reaction.  Will go ahead with MRI to further assess.  Icing, diclofenac with tylenol as needed in meantime.  Rest from weight bearing exercise.

## 2019-02-28 NOTE — Patient Instructions (Signed)
We will go ahead with an MRI of your knee to assess for a very proximal tibial stress fracture. Ice the area 15 minutes at a time 3-4 times a day. Diclofenac 75mg  twice a day with food for pain and inflammation. Ok to take tylenol with this. I will contact you with the results and next steps.

## 2019-03-01 ENCOUNTER — Encounter: Payer: Self-pay | Admitting: Family Medicine

## 2019-03-09 ENCOUNTER — Ambulatory Visit (INDEPENDENT_AMBULATORY_CARE_PROVIDER_SITE_OTHER): Payer: BC Managed Care – PPO | Admitting: Family Medicine

## 2019-03-09 ENCOUNTER — Other Ambulatory Visit: Payer: Self-pay

## 2019-03-09 VITALS — BP 128/84 | Wt 170.0 lb

## 2019-03-09 DIAGNOSIS — M25561 Pain in right knee: Secondary | ICD-10-CM

## 2019-03-10 DIAGNOSIS — M25561 Pain in right knee: Secondary | ICD-10-CM | POA: Diagnosis not present

## 2019-03-10 DIAGNOSIS — M23351 Other meniscus derangements, posterior horn of lateral meniscus, right knee: Secondary | ICD-10-CM | POA: Diagnosis not present

## 2019-03-10 NOTE — Progress Notes (Signed)
Patient came in today to discuss his knee pain.  Still located proximal tibia anteriorly, fairly severe and worse with weight bearing.  He saw a personal trainer who told him it was a cartilage issue so wondering if he needs the MRI.  He has no joint line tenderness today, negative apleys, mcmurrays.  Pain is distal to patellar tendon and level of pain would not be consistent with this typically.  Advised to go ahead with MRI as scheduled.

## 2019-03-14 ENCOUNTER — Ambulatory Visit (INDEPENDENT_AMBULATORY_CARE_PROVIDER_SITE_OTHER): Payer: BC Managed Care – PPO | Admitting: Family Medicine

## 2019-03-14 ENCOUNTER — Encounter: Payer: Self-pay | Admitting: Family Medicine

## 2019-03-14 ENCOUNTER — Other Ambulatory Visit: Payer: Self-pay

## 2019-03-14 VITALS — BP 118/82 | Ht 70.0 in | Wt 170.0 lb

## 2019-03-14 DIAGNOSIS — G8929 Other chronic pain: Secondary | ICD-10-CM

## 2019-03-14 DIAGNOSIS — M25561 Pain in right knee: Secondary | ICD-10-CM

## 2019-03-14 MED ORDER — METHYLPREDNISOLONE ACETATE 40 MG/ML IJ SUSP
40.0000 mg | Freq: Once | INTRAMUSCULAR | Status: AC
  Administered 2019-03-14: 40 mg via INTRA_ARTICULAR

## 2019-03-14 NOTE — Addendum Note (Signed)
Addended by: Jolinda Croak E on: 03/14/2019 11:40 AM   Modules accepted: Orders

## 2019-03-14 NOTE — Patient Instructions (Signed)
Your MRI is reassuring. 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 These are the different medications you can take for this: Tylenol 500mg  1-2 tabs three times a day for pain. Capsaicin, aspercreme, or biofreeze topically up to four times a day may also help with pain. Some supplements that may help for arthritis: Boswellia extract, curcumin, pycnogenol Diclofenac twice a day with food for pain and inflammation. Cortisone injections are an option - consider this today. If cortisone injections do not help, there are different types of shots that may help but they take longer to take effect (gel injections). It's important that you continue to stay active. Continue quad and hamstring strengthening. When you return to running only run every other day and do the slow increase we discussed (1:1 for 10 minutes, 2:1 for 15 minutes, etc). Consider physical therapy to strengthen muscles around the joint that hurts to take pressure off of the joint itself. Shoe inserts with good arch support may be helpful. Heat or ice 15 minutes at a time 3-4 times a day as needed to help with pain. Follow up with me in 1 month.

## 2019-03-14 NOTE — Progress Notes (Signed)
Patient came in today to go over MRI results.  These were reassuring - no evidence stress reaction/fracture of tibia.  He does have grade 4 cartilage wear posterior lateral compartment with some grade 2 patellofemoral changes.  Noted lateral meniscus tear but believe this is degenerative - no tenderness lateral joint line, negative thessalys apleys and mcmurrays again today.  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  Otherwise reassured patient.   He is going to continue working with his trainer.  Discussed medications, supplements that may help (see instructions).  Discussed slow return to weight bearing exercise.  Steroid injection given today as well.  F/u in 1 month.  After informed written consent timeout was performed, patient was seated on exam table. Right knee was prepped with alcohol swab and utilizing anteromedial approach, patient's right knee was injected intraarticularly with 3:1 bupivicaine: depomedrol. Patient tolerated the procedure well without immediate complications.

## 2019-03-19 ENCOUNTER — Other Ambulatory Visit: Payer: BC Managed Care – PPO

## 2019-03-23 ENCOUNTER — Ambulatory Visit: Payer: BC Managed Care – PPO | Admitting: Family Medicine

## 2019-03-25 ENCOUNTER — Encounter: Payer: Self-pay | Admitting: Sports Medicine

## 2019-04-11 ENCOUNTER — Encounter: Payer: Self-pay | Admitting: Family Medicine

## 2019-04-11 ENCOUNTER — Ambulatory Visit (INDEPENDENT_AMBULATORY_CARE_PROVIDER_SITE_OTHER): Payer: BC Managed Care – PPO | Admitting: Family Medicine

## 2019-04-11 ENCOUNTER — Other Ambulatory Visit: Payer: Self-pay

## 2019-04-11 VITALS — BP 100/72 | Ht 70.0 in | Wt 170.0 lb

## 2019-04-11 DIAGNOSIS — G8929 Other chronic pain: Secondary | ICD-10-CM | POA: Diagnosis not present

## 2019-04-11 DIAGNOSIS — M25561 Pain in right knee: Secondary | ICD-10-CM | POA: Diagnosis not present

## 2019-04-11 MED ORDER — NITROGLYCERIN 0.2 MG/HR TD PT24: MEDICATED_PATCH | TRANSDERMAL | 1 refills | Status: DC

## 2019-04-11 MED FILL — NITROGLYCERIN 0.2 MG/HR PTC: 0.2 | 28 days supply | Qty: 7 | Fill #0

## 2019-04-11 NOTE — Patient Instructions (Signed)
Continue working with Therapist, nutritional. Add decline half-squats 3 sets of 10 once a day as we discussed. Try nitro patch over the painful spot - 1/4 patch to affected area, change daily. Follow up with me in 6 weeks for reevaluation.

## 2019-04-11 NOTE — Progress Notes (Signed)
PCP: Patient, No Pcp Per  Subjective:   HPI: Patient is a 58 y.o. male here for right shin pain.  07/05/18: RT knee pain seen 7/30 Partial Rect Femoris tear and seroma RT knee did well  Pain over RT tibial tubercle was present then Went away Came back 2 weeks ago after dead lifts  Since 05-08-2023 has had a lot of swelling in left knee No specific injury but was doing heavy lifts and squats  02/28/19: Patient reports he's continued to struggle with right proximal tibia pain since last visit. He's continued to try to exercise but especially past 5 days pain has become so severe even with weight bearing. No pain at joint lines or in area of patellar tendon. Pain currently 3/10 and a soreness but much worse with weight bearing. Radiographs were negative. No skin changes, numbness.  8/31: Patient came in today to go over MRI results.  These were reassuring - no evidence stress reaction/fracture of tibia.  He does have grade 4 cartilage wear posterior lateral compartment with some grade 2 patellofemoral changes.  Noted lateral meniscus tear but believe this is degenerative - no tenderness lateral joint line, negative thessalys apleys and mcmurrays again today.  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  Otherwise reassured patient.   He is going to continue working with his trainer.  Discussed medications, supplements that may help (see instructions).  Discussed slow return to weight bearing exercise.  Steroid injection given today as well.  F/u in 1 month.  After informed written consent timeout was performed, patient was seated on exam table. Right knee was prepped with alcohol swab and utilizing anteromedial approach, patient's right knee was injected intraarticularly with 3:1 bupivicaine: depomedrol. Patient tolerated the procedure well  without immediate complications.  9/28: Patient reports mild improvement since last visit. He's working with trainer three times a week and doing some rehab within that. Done taking diclofenac as of a couple weeks ago. Injection helped quite a bit with his pain. Now pain more localized just to below knee around distal patellar tendon, IT band. Notices pain if walking around a lot. No skin changes, swelling, bruising.  History reviewed. No pertinent past medical history.  Current Outpatient Medications on File Prior to Visit  Medication Sig Dispense Refill  . diclofenac (VOLTAREN) 75 MG EC tablet Take 1 tablet (75 mg total) by mouth 2 (two) times daily. (Patient not taking: Reported on 04/11/2019) 60 tablet 1   No current facility-administered medications on file prior to visit.     History reviewed. No pertinent surgical history.  No Known Allergies  Social History   Socioeconomic History  . Marital status: Single    Spouse name: Not on file  . Number of children: Not on file  . Years of education: Not on file  . Highest education level: Not on file  Occupational History  . Not on file  Social Needs  . Financial resource strain: Not on file  . Food insecurity    Worry: Not on file    Inability: Not on file  . Transportation needs    Medical: Not on file    Non-medical: Not on file  Tobacco Use  . Smoking status: Never Smoker  . Smokeless tobacco: Never Used  Substance and Sexual Activity  . Alcohol use: Yes    Alcohol/week: 1.0 - 2.0 standard  drinks    Types: 1 - 2 Standard drinks or equivalent per week  . Drug use: No  . Sexual activity: Yes  Lifestyle  . Physical activity    Days per week: Not on file    Minutes per session: Not on file  . Stress: Not on file  Relationships  . Social Herbalist on phone: Not on file    Gets together: Not on file    Attends religious service: Not on file    Active member of club or organization: Not on file     Attends meetings of clubs or organizations: Not on file    Relationship status: Not on file  . Intimate partner violence    Fear of current or ex partner: Not on file    Emotionally abused: Not on file    Physically abused: Not on file    Forced sexual activity: Not on file  Other Topics Concern  . Not on file  Social History Narrative  . Not on file    Family History  Problem Relation Age of Onset  . Cancer Mother        Breast cancer  . Cancer Father        Lung cancer  . Gout Brother   . Heart disease Maternal Grandmother   . Early death Maternal Grandfather   . Gout Brother   . Gout Brother   . Gout Brother     BP 100/72   Ht 5\' 10"  (1.778 m)   Wt 170 lb (77.1 kg)   BMI 24.39 kg/m   Review of Systems: See HPI above.     Objective:  Physical Exam:  Gen: NAD, comfortable in exam room  Right knee: No gross deformity, ecchymoses, effusion. No TTP. FROM with 5/5 strength flexion and extension. Negative ant/post drawers. Negative valgus/varus testing. Negative lachmans. Negative mcmurrays, apleys, thessalys, patellar apprehension. NV intact distally.  Assessment & Plan:  1. Right knee pain - s/p steroid injection.  Doing home exercises and working with Physiological scientist.  MRI was reassuring overall - noted lateral compartment grade 4 chondromalacia.  Meniscal testing again negative.  Add eccentric exercises for patellar tendon, will trial nitro patches as well.  Consider physical therapy.  F/u in 6 weeks.

## 2019-05-25 ENCOUNTER — Encounter: Payer: Self-pay | Admitting: Family Medicine

## 2019-05-25 ENCOUNTER — Ambulatory Visit (INDEPENDENT_AMBULATORY_CARE_PROVIDER_SITE_OTHER): Payer: BC Managed Care – PPO | Admitting: Family Medicine

## 2019-05-25 ENCOUNTER — Other Ambulatory Visit: Payer: Self-pay

## 2019-05-25 VITALS — BP 102/74 | Ht 70.0 in | Wt 170.0 lb

## 2019-05-25 DIAGNOSIS — G8929 Other chronic pain: Secondary | ICD-10-CM

## 2019-05-25 DIAGNOSIS — M25561 Pain in right knee: Secondary | ICD-10-CM | POA: Diagnosis not present

## 2019-05-25 MED FILL — NITROGLYCERIN 0.2 MG/HR PTC: 0.2 | 28 days supply | Qty: 7 | Fill #1

## 2019-05-25 NOTE — Progress Notes (Signed)
PCP: Patient, No Pcp Per  Subjective:   HPI: Patient is a 58 y.o. male here for right shin pain.  07/05/18: RT knee pain seen 7/30 Partial Rect Femoris tear and seroma RT knee did well  Pain over RT tibial tubercle was present then Went away Came back 2 weeks ago after dead lifts  Since May 12, 2023 has had a lot of swelling in left knee No specific injury but was doing heavy lifts and squats  02/28/19: Patient reports he's continued to struggle with right proximal tibia pain since last visit. He's continued to try to exercise but especially past 5 days pain has become so severe even with weight bearing. No pain at joint lines or in area of patellar tendon. Pain currently 3/10 and a soreness but much worse with weight bearing. Radiographs were negative. No skin changes, numbness.  8/31: Patient came in today to go over MRI results.  These were reassuring - no evidence stress reaction/fracture of tibia.  He does have grade 4 cartilage wear posterior lateral compartment with some grade 2 patellofemoral changes.  Noted lateral meniscus tear but believe this is degenerative - no tenderness lateral joint line, negative thessalys apleys and mcmurrays again today.  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  Otherwise reassured patient.   He is going to continue working with his trainer.  Discussed medications, supplements that may help (see instructions).  Discussed slow return to weight bearing exercise.  Steroid injection given today as well.  F/u in 1 month.  After informed written consent timeout was performed, patient was seated on exam table. Right knee was prepped with alcohol swab and utilizing anteromedial approach, patient's right knee was injected intraarticularly with 3:1 bupivicaine: depomedrol. Patient tolerated the procedure well  without immediate complications.  9/28: Patient reports mild improvement since last visit. He's working with trainer three times a week and doing some rehab within that. Done taking diclofenac as of a couple weeks ago. Injection helped quite a bit with his pain. Now pain more localized just to below knee around distal patellar tendon, IT band. Notices pain if walking around a lot. No skin changes, swelling, bruising.  11/11: Patient reports he is improving. Using nitro patches, doing home exercises, working with his trainer 3 times a week. Has some soreness below knee after about 5-10 minutes of prolonged standing. No pain with lying down.  Not waking him at night. Better in the morning. No swelling, other concerns.  History reviewed. No pertinent past medical history.  Current Outpatient Medications on File Prior to Visit  Medication Sig Dispense Refill  . diclofenac (VOLTAREN) 75 MG EC tablet Take 1 tablet (75 mg total) by mouth 2 (two) times daily. (Patient not taking: Reported on 04/11/2019) 60 tablet 1  . nitroGLYCERIN (NITRODUR - DOSED IN MG/24 HR) 0.2 mg/hr patch Apply 1/4th patch to affected knee, change daily (Patient not taking: Reported on 05/25/2019) 30 patch 1   No current facility-administered medications on file prior to visit.     History reviewed. No pertinent surgical history.  No Known Allergies  Social History   Socioeconomic History  . Marital status: Single    Spouse name: Not on file  . Number of children: Not on file  . Years of education: Not on file  . Highest education level: Not on file  Occupational History  . Not on  file  Social Needs  . Financial resource strain: Not on file  . Food insecurity    Worry: Not on file    Inability: Not on file  . Transportation needs    Medical: Not on file    Non-medical: Not on file  Tobacco Use  . Smoking status: Never Smoker  . Smokeless tobacco: Never Used  Substance and Sexual Activity  . Alcohol  use: Yes    Alcohol/week: 1.0 - 2.0 standard drinks    Types: 1 - 2 Standard drinks or equivalent per week  . Drug use: No  . Sexual activity: Yes  Lifestyle  . Physical activity    Days per week: Not on file    Minutes per session: Not on file  . Stress: Not on file  Relationships  . Social Herbalist on phone: Not on file    Gets together: Not on file    Attends religious service: Not on file    Active member of club or organization: Not on file    Attends meetings of clubs or organizations: Not on file    Relationship status: Not on file  . Intimate partner violence    Fear of current or ex partner: Not on file    Emotionally abused: Not on file    Physically abused: Not on file    Forced sexual activity: Not on file  Other Topics Concern  . Not on file  Social History Narrative  . Not on file    Family History  Problem Relation Age of Onset  . Cancer Mother        Breast cancer  . Cancer Father        Lung cancer  . Gout Brother   . Heart disease Maternal Grandmother   . Early death Maternal Grandfather   . Gout Brother   . Gout Brother   . Gout Brother     BP 102/74   Ht 5\' 10"  (1.778 m)   Wt 170 lb (77.1 kg)   BMI 24.39 kg/m   Review of Systems: See HPI above.     Objective:  Physical Exam:  Gen: NAD, comfortable in exam room  Right knee: No gross deformity, ecchymoses, swelling. No TTP. FROM. Negative ant/post drawers. Negative valgus/varus testing. Negative lachmans. Negative mcmurrays, apleys, patellar apprehension. NV intact distally.  Assessment & Plan:  1. Right knee pain - s/p intraarticular injection.  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  He is improving - continue with nitro patches, home exercise program, working with Physiological scientist.  F/u in 6-8 weeks.

## 2019-05-25 NOTE — Patient Instructions (Signed)
You're doing great! Continue your home exercises as we discussed - 3 sets of 10 once a day focusing on the decline half-squats. Continue working with Therapist, nutritional in advancing your activity level as tolerated. Continue nitro patches 1/4th patch to affected area, change daily. Follow up with me in 6-8 weeks for reevaluation.

## 2019-06-07 DIAGNOSIS — M79661 Pain in right lower leg: Secondary | ICD-10-CM | POA: Diagnosis not present

## 2019-06-07 DIAGNOSIS — M1711 Unilateral primary osteoarthritis, right knee: Secondary | ICD-10-CM | POA: Diagnosis not present

## 2019-06-07 DIAGNOSIS — M25561 Pain in right knee: Secondary | ICD-10-CM | POA: Diagnosis not present

## 2019-06-20 NOTE — Telephone Encounter (Signed)
See more recent patient email for details and response.

## 2019-06-27 ENCOUNTER — Encounter (HOSPITAL_COMMUNITY): Payer: Self-pay | Admitting: Psychiatry

## 2019-06-27 ENCOUNTER — Ambulatory Visit (HOSPITAL_COMMUNITY)
Admission: RE | Admit: 2019-06-27 | Discharge: 2019-06-27 | Disposition: A | Discharge status: Home / Self Care | Payer: BC Managed Care – PPO | Attending: Psychiatry | Admitting: Psychiatry

## 2019-06-27 ENCOUNTER — Other Ambulatory Visit: Payer: Self-pay

## 2019-06-27 ENCOUNTER — Ambulatory Visit (INDEPENDENT_AMBULATORY_CARE_PROVIDER_SITE_OTHER): Payer: BC Managed Care – PPO | Admitting: Family Medicine

## 2019-06-27 VITALS — BP 134/88 | Ht 70.0 in | Wt 170.0 lb

## 2019-06-27 DIAGNOSIS — M25461 Effusion, right knee: Secondary | ICD-10-CM | POA: Diagnosis not present

## 2019-06-27 DIAGNOSIS — M25561 Pain in right knee: Secondary | ICD-10-CM

## 2019-06-27 DIAGNOSIS — F329 Major depressive disorder, single episode, unspecified: Secondary | ICD-10-CM | POA: Insufficient documentation

## 2019-06-27 DIAGNOSIS — G8929 Other chronic pain: Secondary | ICD-10-CM

## 2019-06-27 DIAGNOSIS — F4323 Adjustment disorder with mixed anxiety and depressed mood: Secondary | ICD-10-CM

## 2019-06-27 DIAGNOSIS — R45851 Suicidal ideations: Secondary | ICD-10-CM | POA: Insufficient documentation

## 2019-06-27 DIAGNOSIS — R5383 Other fatigue: Secondary | ICD-10-CM

## 2019-06-27 DIAGNOSIS — R451 Restlessness and agitation: Secondary | ICD-10-CM | POA: Diagnosis not present

## 2019-06-27 MED ORDER — FLUOXETINE HCL 10 MG PO CAPS: 10.0000 mg | ORAL_CAPSULE | Freq: Every day | ORAL | 1 refills | Status: DC

## 2019-06-27 NOTE — BH Assessment (Signed)
Assessment Note  Robert Huff is an 58 y.o. male presenting voluntarily to [facility] for assessment. He is accompanied by his partner, Darl Pikes, who waits in lobby during assessment and provides collateral information. Patient reports 4 months ago he suffered a knee injury after excessive [employer]. He states he is no longer able to exercise and that was a major part of his life. Additionally, he reports he is in a lot of pain and cannot do the things he used to. He states he has felt passively suicidal for a couple months now. He has had thoughts of overdosing or jumping off a parking deck. Patient states he would not do these things as he still has hope and loves his life. He denies HI/AVH. Patient does not have a history of any psychiatric treatment or substance abuse.   Patient is alert and oriented x 4. He is dressed appropriately. His speech is logical, eye contact is good, and thoughts are organized. Patient's mood is depressed and his affect is tearful. His insight, judgement, and impulse control are intact. He does not appear to be responding to internal stimuli or experiencing delusional thought content.   Diagnosis: F43.21 Adjustment disorder, with depressed mood  Past Medical History: History reviewed. No pertinent past medical history.  History reviewed. No pertinent surgical history.  Family History:  Family History  Problem Relation Age of Onset  . Cancer Mother        Breast cancer  . Cancer Father        Lung cancer  . Gout Brother   . Heart disease Maternal Grandmother   . Early death Maternal Grandfather   . Gout Brother   . Gout Brother   . Gout Brother     Social History:  reports that he has never smoked. He has never used smokeless tobacco. He reports current alcohol use of about 1.0 - 2.0 standard drinks of alcohol per week. He reports that he does not use drugs.  Additional Social History:  Alcohol / Drug Use Pain Medications: see MAR Prescriptions: see MAR Over  the Counter: see MAR History of alcohol / drug use?: No history of alcohol / drug abuse  CIWA: CIWA-Ar BP: (!) 156/109 Pulse Rate: (!) 115 COWS:    Allergies: No Known Allergies  Home Medications: (Not in a hospital admission)   OB/GYN Status:  No LMP for male patient.  General Assessment Data Location of Assessment: [facility] Assessment Services TTS Assessment: In system Is this a Tele or Face-to-Face Assessment?: Face-to-Face Is this an Initial Assessment or a Re-assessment for this encounter?: Initial Assessment Patient Accompanied by:: N/A Language Other than English: No Living Arrangements: (his home) What gender do you identify as?: Male Marital status: Long term relationship Maiden name: [place] Pregnancy Status: No Living Arrangements: Spouse/significant other Can pt return to current living arrangement?: Yes Admission Status: Voluntary Is patient capable of signing voluntary admission?: Yes Referral Source: Self/Family/Friend Insurance type: BCBS     Crisis Care Plan Living Arrangements: Spouse/significant other Legal Guardian: (self) Name of Psychiatrist: none Name of Therapist: none  Education Status Is patient currently in school?: No Is the patient employed, unemployed or receiving disability?: Employed  Risk to self with the past 6 months Suicidal Ideation: No-Not Currently/Within Last 6 Months Has patient been a risk to self within the past 6 months prior to admission? : Yes Suicidal Intent: No Has patient had any suicidal intent within the past 6 months prior to admission? : No Is patient at  risk for suicide?: No Suicidal Plan?: No-Not Currently/Within Last 6 Months Has patient had any suicidal plan within the past 6 months prior to admission? : Yes Access to Means: No What has been your use of drugs/alcohol within the last 12 months?: denies Previous Attempts/Gestures: No How many times?: 0 Other Self Harm Risks: none Triggers for Past Attempts:  None known Intentional Self Injurious Behavior: None Family Suicide History: No Recent stressful life event(s): Recent negative physical changes Persecutory voices/beliefs?: No Depression: Yes Depression Symptoms: Despondent, Insomnia, Isolating, Tearfulness, Fatigue, Guilt, Loss of interest in usual pleasures, Feeling worthless/self pity, Feeling angry/irritable Substance abuse history and/or treatment for substance abuse?: No Suicide prevention information given to non-admitted patients: Not applicable  Risk to Others within the past 6 months Homicidal Ideation: No Does patient have any lifetime risk of violence toward others beyond the six months prior to admission? : No Thoughts of Harm to Others: No Current Homicidal Intent: No Current Homicidal Plan: No Access to Homicidal Means: No Identified Victim: none History of harm to others?: No Assessment of Violence: None Noted Violent Behavior Description: none noted Does patient have access to weapons?: No Criminal Charges Pending?: No Does patient have a court date: No Is patient on probation?: No  Psychosis Hallucinations: None noted Delusions: None noted  Mental Status Report Appearance/Hygiene: Unremarkable Eye Contact: Good Motor Activity: Freedom of movement Speech: Logical/coherent Level of Consciousness: Alert Mood: Depressed Affect: Depressed Anxiety Level: Minimal Thought Processes: Coherent, Relevant Judgement: Partial Orientation: Person, Place, Time, Situation Obsessive Compulsive Thoughts/Behaviors: None  Cognitive Functioning Concentration: Normal Memory: Recent Intact, Remote Intact Is patient IDD: No Insight: Fair Impulse Control: Fair Appetite: Good Have you had any weight changes? : No Change Sleep: Decreased Total Hours of Sleep: (UTA) Vegetative Symptoms: None  ADLScreening [facility] Assessment Services) Patient's cognitive ability adequate to safely complete daily activities?: Yes Patient  able to express need for assistance with ADLs?: Yes Independently performs ADLs?: Yes (appropriate for developmental age)  Prior Inpatient Therapy Prior Inpatient Therapy: No  Prior Outpatient Therapy Prior Outpatient Therapy: No Does patient have an ACCT team?: No Does patient have Intensive In-House Services?  : No Does patient have [facility] services? : No Does patient have P4CC services?: No  ADL Screening (condition at time of admission) Patient's cognitive ability adequate to safely complete daily activities?: Yes Is the patient deaf or have difficulty hearing?: No Does the patient have difficulty seeing, even when wearing glasses/contacts?: No Patient able to express need for assistance with ADLs?: Yes Does the patient have difficulty dressing or bathing?: No Independently performs ADLs?: Yes (appropriate for developmental age) Does the patient have difficulty walking or climbing stairs?: No Weakness of Legs: None Weakness of Arms/Hands: None  Home Assistive Devices/Equipment Home Assistive Devices/Equipment: None  Therapy Consults (therapy consults require a physician order) PT Evaluation Needed: No OT Evalulation Needed: No SLP Evaluation Needed: No Abuse/Neglect Assessment (Assessment to be complete while patient is alone) Abuse/Neglect Assessment Can Be Completed: Yes Physical Abuse: Denies Verbal Abuse: Denies Sexual Abuse: Denies Exploitation of patient/patient's resources: Denies Self-Neglect: Denies Values / Beliefs Cultural Requests During Hospitalization: None Spiritual Requests During Hospitalization: None Consults Spiritual Care Consult Needed: No Transition of Care Team Consult Needed: No Advance Directives (For Healthcare) Does Patient Have a Medical Advance Directive?: No Would patient like information on creating a medical advance directive?: No - Patient declined          Disposition: Per Earleen Newport, NP patient does not meet in patient  criteria. Discharge with a referral to [facility]. Disposition Initial Assessment Completed for this Encounter: Yes Disposition of Patient: Discharge Patient refused recommended treatment: No Mode of transportation if patient is discharged/movement?: Car Patient referred to: Outpatient clinic referral  On Site Evaluation by:   Reviewed with Physician:    Celedonio MiyamotoMeredith  Sarahlynn Cisnero 06/27/2019 6:05 PM

## 2019-06-27 NOTE — Patient Instructions (Addendum)
Get bloodwork after you leave today - I'll call you tomorrow with the results that have come back. [place] Those will dictate where we go from here. Try to avoid painful activities when you can.  Start prozac and take as directed. We will put in a referral for CBT as well as we discussed which helps a lot with the depressive symptoms. If you're feeling extremely depressed and hopeless call Behavioral Health [phone]), myself, or family. Behavioral health could get the treatment process started faster if your symptoms aren't turning around.  [facility] [address] Lady [place]

## 2019-06-27 NOTE — H&P (Signed)
[place] Screening Exam  Robert Huff is an 58 y.o. male patient presents to [facility] as a walk-in with complaints of depression that has been worsening over the last 4 months related to not being as active as he used to be.  Patient reports that he feels that he was addicted to exercise have been trending [employer] and competitions now with injury to the knee he is not able to do the things that he was used to and has been feeling depressed sitting around the house.  Patient reports he has had some suicidal thoughts but does not feel that he would carry any out.  Patient is states that he does not have any prior psychiatric history, has never tried to kill himself, or any self-harm.  Patient also denies family history of psychiatric illnesses or suicide attempts.  Patient lives with his girlfriend who is very supportive.  Gave permission to speak with his girlfriend Robert Huff for collateral information who states that she also feels that the patient is safe to go home.  Patient is interested in outpatient psychiatric services.  At this time patient denies suicidal/self-harm/homicidal ideations, psychosis, paranoia.  Total Time spent with patient: 30 minutes  Psychiatric Specialty Exam: Physical Exam  Nursing note and vitals reviewed. Constitutional: He is oriented to person, place, and time. He appears well-developed and well-nourished. No distress.  Respiratory: Effort normal.  Musculoskeletal:        General: Normal range of motion.     Cervical back: Normal range of motion.     Comments: Complains of pain in her right knee  Neurological: He is alert and oriented to person, place, and time.  Skin: Skin is warm and dry.  Psychiatric: His speech is normal. Judgment and thought content normal. He is agitated. Cognition and memory are normal. Depressed: Stamle.    Review of Systems  Musculoskeletal: Positive for joint swelling ( Complaints of pain in right knee reports diagnosed  with tendinitis).  Psychiatric/Behavioral: Agitation:  Denies. Confusion:  Denies. Hallucinations:  Denies. Self-injury:  Denies. Sleep disturbance:  Denies. Suicidal ideas:  Denies. Nervous/anxious:  Denies.        Complains of depression that has been worsening over the last 4 months related to not being as active as he used to be.  Reported depression is stable at this time but would like to have outpatient psychiatric services  All other systems reviewed and are negative.   Blood pressure (!) 156/109, pulse (!) 115, temperature 99.4 F (37.4 C), temperature source Oral, resp. rate 20, SpO2 98 %.There is no height or weight on file to calculate BMI.  General Appearance: Casual  Eye Contact:  Good  Speech:  Clear and Coherent and Normal Rate  Volume:  Normal  Mood:  Depressed  Affect:  Congruent and Depressed  Thought Process:  Coherent, Goal Directed and Descriptions of Associations: Intact  Orientation:  Full (Time, Place, and Person)  Thought Content:  WDL and Logical  Suicidal Thoughts:  No  Homicidal Thoughts:  No  Memory:  Immediate;   Good Recent;   Good  Judgement:  Intact  Insight:  Good and Present  Psychomotor Activity:  Normal  Concentration: Concentration: Good and Attention Span: Good  Recall:  Good  Fund of Knowledge:Good  Language: Good  Akathisia:  No  Handed:  Right  AIMS (if indicated):     Assets:  Communication Skills Desire for Improvement Financial Resources/Insurance Housing Social Support Transportation  Sleep:  Musculoskeletal: Strength & Muscle Tone: within normal limits Gait & Station: normal Patient leans: N/A  Blood pressure (!) 156/109, pulse (!) 115, temperature 99.4 F (37.4 C), temperature source Oral, resp. rate 20, SpO2 98 %.  Recommendations: Outpatient psychiatric services.  Patient referred to [facility] outpatient services (IOP or PHP) patient informed that someone would give him a call tomorrow morning.  Based on my  evaluation the patient does not appear to have an emergency medical condition.   Disposition: Patient psychiatrically cleared No evidence of imminent risk to self or others at present.   Patient does not meet criteria for psychiatric inpatient admission. Supportive therapy provided about ongoing stressors. Discussed crisis plan, support from social network, calling 911, coming to the Emergency Department, and calling Suicide Hotline.  Raymonde Hamblin, NP 06/27/2019, 6:02 PM

## 2019-06-28 ENCOUNTER — Telehealth (HOSPITAL_COMMUNITY): Payer: Self-pay | Admitting: Psychiatry

## 2019-06-28 ENCOUNTER — Encounter: Payer: Self-pay | Admitting: Family Medicine

## 2019-06-28 DIAGNOSIS — H52223 Regular astigmatism, bilateral: Secondary | ICD-10-CM | POA: Diagnosis not present

## 2019-06-28 MED ORDER — VITAMIN D (ERGOCALCIFEROL) 1.25 MG (50000 UNIT) PO CAPS: 50000.0000 [IU] | ORAL_CAPSULE | ORAL | 0 refills | Status: DC

## 2019-06-28 NOTE — Telephone Encounter (Signed)
D:  Meredith T. (TTS) referred pt to [facility]-IOP.  A:  Placed call to orient and provide pt with a start date.  Pt declined, stating that he will be working during the hrs that IOP meets.  "No one ever told me that you meet during the day."  Case manager offered individual therapy, so he can schedule around his schedule.  Pt is wanting to know the price of seeing a therapist.  Pt was transferred to the front desk in order to be scheduled with a therapist and to ask his questions.

## 2019-06-28 NOTE — Addendum Note (Signed)
Addended by: Dene Gentry on: 06/28/2019 02:56 PM   Modules accepted: Orders

## 2019-06-28 NOTE — Progress Notes (Addendum)
PCP: Patient, No Pcp Per  Subjective:   HPI: Patient is a 58 y.o. male here for right shin pain.  07/05/18: RT knee pain seen 7/30 Partial Rect Femoris tear and seroma RT knee did well  Pain over RT tibial tubercle was present then Went away Came back 2 weeks ago after dead lifts  Since 06/05/2023 has had a lot of swelling in left knee No specific injury but was doing heavy lifts and squats  02/28/19: Patient reports he's continued to struggle with right proximal tibia pain since last visit. He's continued to try to exercise but especially past 5 days pain has become so severe even with weight bearing. No pain at joint lines or in area of patellar tendon. Pain currently 3/10 and a soreness but much worse with weight bearing. Radiographs were negative. No skin changes, numbness.  8/31: Patient came in today to go over MRI results.  These were reassuring - no evidence stress reaction/fracture of tibia.  He does have grade 4 cartilage wear posterior lateral compartment with some grade 2 patellofemoral changes.  Noted lateral meniscus tear but believe this is degenerative - no tenderness lateral joint line, negative thessalys apleys and mcmurrays again today.  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  Otherwise reassured patient.   He is going to continue working with his trainer.  Discussed medications, supplements that may help (see instructions).  Discussed slow return to weight bearing exercise.  Steroid injection given today as well.  F/u in 1 month.  After informed written consent timeout was performed, patient was seated on exam table. Right knee was prepped with alcohol swab and utilizing anteromedial approach, patient's right knee was injected intraarticularly with 3:1 bupivicaine: depomedrol. Patient tolerated the procedure well  without immediate complications.  9/28: Patient reports mild improvement since last visit. He's working with trainer three times a week and doing some rehab within that. Done taking diclofenac as of a couple weeks ago. Injection helped quite a bit with his pain. Now pain more localized just to below knee around distal patellar tendon, IT band. Notices pain if walking around a lot. No skin changes, swelling, bruising.  11/11: Patient reports he is improving. Using nitro patches, doing home exercises, working with his trainer 3 times a week. Has some soreness below knee after about 5-10 minutes of prolonged standing. No pain with lying down.  Not waking him at night. Better in the morning. No swelling, other concerns.  12/14: Patient continues to struggle with pain in right lower extremity with pain localized just below knee on the right only. No swelling, skin changes. Radiates medial and lateral from the spot. Ok when sitting but when he bears weight pain is really bothersome. Worsened specifically when he stood holding 50 dumbbells before he laid down to do chest presses. He's doing a 40 minute workout 3 times a week. His persistent pain and inability to get back into working out have made him very depressed. Partner expresses this as well that she's noticed this and patient reported he's had feelings of hurting himself though denies thought out plan. Yesterday he said he couldn't decide if he would go to the grocery store or jump off a building. He has history of depressive symptoms only once before in the '90s when he was laid off from a job. Has never been on medications or received  treatment for depressive or anxiety symptoms.  History reviewed. No pertinent past medical history.  Current Outpatient Medications on File Prior to Visit  Medication Sig Dispense Refill  . nitroGLYCERIN (NITRODUR - DOSED IN MG/24 HR) 0.2 mg/hr patch Apply 1/4th patch to affected knee, change daily  (Patient not taking: Reported on 05/25/2019) 30 patch 1   No current facility-administered medications on file prior to visit.    History reviewed. No pertinent surgical history.  No Known Allergies  Social History   Socioeconomic History  . Marital status: Single    Spouse name: Not on file  . Number of children: Not on file  . Years of education: Not on file  . Highest education level: Not on file  Occupational History  . Not on file  Tobacco Use  . Smoking status: Never Smoker  . Smokeless tobacco: Never Used  Substance and Sexual Activity  . Alcohol use: Yes    Alcohol/week: 1.0 - 2.0 standard drinks    Types: 1 - 2 Standard drinks or equivalent per week  . Drug use: No  . Sexual activity: Yes  Other Topics Concern  . Not on file  Social History Narrative  . Not on file   Social Determinants of Health   Financial Resource Strain:   . Difficulty of Paying Living Expenses: Not on file  Food Insecurity:   . Worried About Charity fundraiser in the Last Year: Not on file  . Ran Out of Food in the Last Year: Not on file  Transportation Needs:   . Lack of Transportation (Medical): Not on file  . Lack of Transportation (Non-Medical): Not on file  Physical Activity:   . Days of Exercise per Week: Not on file  . Minutes of Exercise per Session: Not on file  Stress:   . Feeling of Stress : Not on file  Social Connections:   . Frequency of Communication with Friends and Family: Not on file  . Frequency of Social Gatherings with Friends and Family: Not on file  . Attends Religious Services: Not on file  . Active Member of Clubs or Organizations: Not on file  . Attends Archivist Meetings: Not on file  . Marital Status: Not on file  Intimate Partner Violence:   . Fear of Current or Ex-Partner: Not on file  . Emotionally Abused: Not on file  . Physically Abused: Not on file  . Sexually Abused: Not on file    Family History  Problem Relation Age of Onset   . Cancer Mother        Breast cancer  . Cancer Father        Lung cancer  . Gout Brother   . Heart disease Maternal Grandmother   . Early death Maternal Grandfather   . Gout Brother   . Gout Brother   . Gout Brother     BP 134/88   Ht 5' 10" (1.778 m)   Wt 170 lb (77.1 kg)   BMI 24.39 kg/m   Review of Systems: See HPI above.     Objective:  Physical Exam:  Gen: NAD, comfortable in exam room  Right knee: No gross deformity, ecchymoses, swelling, warmth.  No palpable baker's cyst. Minimal TTP proximal tibia near pat tendon insertion. FROM with 5/5 strength not reproducing his pain. Able to do 5 squats with minimal discomfort Negative hop test. Negative ant/post drawers. Negative valgus/varus testing. Negative lachmans. Negative mcmurrays, apleys. NV intact distally.  Right hip: No  deformity. FROM with 5/5 strength. NVI distally. Negative logroll.  Right ankle: No gross deformity, swelling, ecchymoses FROM No TTP Thompsons test negative. NV intact distally.  Assessment & Plan:  1. Right knee pain - Patient continues to struggle despite modifying his activities, using nitro patches, doing home exercises and working with trainer.  His exam is reassuring today and MRI was reassuring as well.  No evidence hip or ankle pathology that would refer to this area.  Hop test negative and his pain is in an area where I'd expect a stress reaction to have shown up on his knee MRI.  He has had other aches and pains though denies obvious synovitis by history.  We also discussed overtraining with his fatigue - want to go ahead with some labwork to include ESR, TSH, CMP, CBC with diff, monospot, uric acid, lyme abs, 25-OH vit D, and ferritin.  2. Adjustment disorder with depressive symptoms - We discussed outpatient vs inpatient treatment - he is going to [facility] hospital now to seek help.  We had sent in prozac though possible they will place him on something else.   Stressed importance of treatment given recent thoughts of hurting himself and he contracts to go over now.  Addendum:  Spoke with patient - he went to [facility] and was evaluated - felt appropriate for outpatient treatment.  He's trying to schedule this around his work schedule.  Preliminary labs showed low vitamin D which can be associated with bone pain - optimistic repleting this will help his symptoms.  Sent in 8 tabs of 50000IU to take weekly.  Will contact him when other results return.

## 2019-07-01 NOTE — Telephone Encounter (Signed)
Called and spoke to patient.  He had a good night last night and pain is improved today.  He's taking prozac without any adverse effects.  Slept better last night.  Started vitamin D.  Still waiting on Lyme antibody test results.  Will contact him next week to check in with him and go over those results.  Advised to message or call me as we discussed previously.

## 2019-07-02 LAB — COMPREHENSIVE METABOLIC PANEL
ALT: 15 IU/L (ref 0–44)
AST: 20 IU/L (ref 0–40)
Albumin/Globulin Ratio: 2 (ref 1.2–2.2)
Albumin: 4.2 g/dL (ref 3.8–4.9)
Alkaline Phosphatase: 69 IU/L (ref 39–117)
BUN/Creatinine Ratio: 15 (ref 10–24)
BUN: 14 mg/dL (ref 8–27)
Bilirubin Total: 0.3 mg/dL (ref 0.0–1.2)
CO2: 24 mmol/L (ref 20–29)
Calcium: 9.4 mg/dL (ref 8.6–10.2)
Chloride: 97 mmol/L (ref 96–106)
Creatinine, Ser: 0.94 mg/dL (ref 0.76–1.27)
GFR calc Af Amer: 101 mL/min/{1.73_m2} (ref >59)
GFR calc non Af Amer: 88 mL/min/{1.73_m2} (ref >59)
Globulin, Total: 2.1 g/dL (ref 1.5–4.5)
Glucose: 89 mg/dL (ref 65–99)
Potassium: 4.7 mmol/L (ref 3.5–5.2)
Sodium: 138 mmol/L (ref 134–144)
Total Protein: 6.3 g/dL (ref 6.0–8.5)

## 2019-07-02 LAB — MONONUCLEOSIS SCREEN: Mono Screen: NEGATIVE

## 2019-07-02 LAB — CBC WITH DIFFERENTIAL/PLATELET
Basophils Absolute: 0 10*3/uL (ref 0.0–0.2)
Basos: 0 %
EOS (ABSOLUTE): 0 10*3/uL (ref 0.0–0.4)
Eos: 0 %
Hematocrit: 38.7 % (ref 37.5–51.0)
Hemoglobin: 13.6 g/dL (ref 13.0–17.7)
Immature Grans (Abs): 0 10*3/uL (ref 0.0–0.1)
Immature Granulocytes: 0 %
Lymphocytes Absolute: 0.9 10*3/uL (ref 0.7–3.1)
Lymphs: 11 %
MCH: 30.8 pg (ref 26.6–33.0)
MCHC: 35.1 g/dL (ref 31.5–35.7)
MCV: 88 fL (ref 79–97)
Monocytes Absolute: 0.5 10*3/uL (ref 0.1–0.9)
Monocytes: 6 %
Neutrophils Absolute: 6.7 10*3/uL (ref 1.4–7.0)
Neutrophils: 83 %
Platelets: 303 10*3/uL (ref 150–450)
RBC: 4.42 x10E6/uL (ref 4.14–5.80)
RDW: 13.5 % (ref 11.6–15.4)
WBC: 8.2 10*3/uL (ref 3.4–10.8)

## 2019-07-02 LAB — LYME, WESTERN BLOT, SERUM (REFLEXED)
IgM P23 Ab.: ABSENT
IgM P41 Ab.: ABSENT
Lyme IgG Wb: POSITIVE — AB
Lyme IgM Wb: NEGATIVE

## 2019-07-02 LAB — URIC ACID: Uric Acid: 5.1 mg/dL (ref 3.8–8.4)

## 2019-07-02 LAB — TSH: TSH: 1.71 u[IU]/mL (ref 0.450–4.500)

## 2019-07-02 LAB — VITAMIN D 25 HYDROXY (VIT D DEFICIENCY, FRACTURES): Vit D, 25-Hydroxy: 21.7 ng/mL — ABNORMAL LOW (ref 30.0–100.0)

## 2019-07-02 LAB — SEDIMENTATION RATE: Sed Rate: 4 mm/hr (ref 0–30)

## 2019-07-02 LAB — B. BURGDORFI ANTIBODIES: Lyme IgG/IgM Ab: 4.04 {ISR} — ABNORMAL HIGH (ref 0.00–0.90)

## 2019-07-02 LAB — FERRITIN: Ferritin: 38 ng/mL (ref 30–400)

## 2019-07-04 ENCOUNTER — Ambulatory Visit: Payer: BC Managed Care – PPO | Admitting: Family Medicine

## 2019-07-05 ENCOUNTER — Other Ambulatory Visit: Payer: Self-pay | Admitting: *Deleted

## 2019-07-05 DIAGNOSIS — B948 Sequelae of other specified infectious and parasitic diseases: Secondary | ICD-10-CM

## 2019-07-05 DIAGNOSIS — B949 Sequelae of unspecified infectious and parasitic disease: Secondary | ICD-10-CM

## 2019-07-05 NOTE — Telephone Encounter (Signed)
Called patient and discussed rest of his lab results with him.  He does have elevated IgG levels (all 10 elevated) suggesting prior Lyme infection.  He states about 1.5 years ago he did have an embedded tick with a small red area after this - he's been seen in our clinic previously and found to have small knee effusions.  He also reports swelling of elbows since then along with fatigue, constitutional symptoms, depressive symptoms I noted previously in the chart.  We also discussed that may have nothing to do with his current symptoms but recommended we go ahead with referral to infectious disease for further evaluation and discussion.

## 2019-07-18 ENCOUNTER — Other Ambulatory Visit: Payer: Self-pay | Admitting: Family Medicine

## 2019-07-20 ENCOUNTER — Other Ambulatory Visit: Payer: Self-pay | Admitting: *Deleted

## 2019-07-20 ENCOUNTER — Other Ambulatory Visit: Payer: Self-pay | Admitting: Family Medicine

## 2019-07-21 ENCOUNTER — Ambulatory Visit: Payer: BC Managed Care – PPO | Admitting: Internal Medicine

## 2019-07-31 ENCOUNTER — Other Ambulatory Visit: Payer: Self-pay

## 2019-07-31 ENCOUNTER — Inpatient Hospital Stay (HOSPITAL_COMMUNITY)
Admission: RE | Admit: 2019-07-31 | Discharge: 2019-08-01 | DRG: 885 | Disposition: A | Discharge status: Home / Self Care | Payer: BC Managed Care – PPO | Attending: Family | Admitting: Family

## 2019-07-31 ENCOUNTER — Encounter (HOSPITAL_COMMUNITY): Payer: Self-pay | Admitting: Family

## 2019-07-31 DIAGNOSIS — M25561 Pain in right knee: Secondary | ICD-10-CM | POA: Diagnosis not present

## 2019-07-31 DIAGNOSIS — R45851 Suicidal ideations: Secondary | ICD-10-CM | POA: Diagnosis not present

## 2019-07-31 DIAGNOSIS — Z803 Family history of malignant neoplasm of breast: Secondary | ICD-10-CM

## 2019-07-31 DIAGNOSIS — G8929 Other chronic pain: Secondary | ICD-10-CM | POA: Diagnosis present

## 2019-07-31 DIAGNOSIS — Z801 Family history of malignant neoplasm of trachea, bronchus and lung: Secondary | ICD-10-CM

## 2019-07-31 DIAGNOSIS — Z9114 Patient's other noncompliance with medication regimen: Secondary | ICD-10-CM | POA: Diagnosis not present

## 2019-07-31 DIAGNOSIS — R279 Unspecified lack of coordination: Secondary | ICD-10-CM | POA: Diagnosis not present

## 2019-07-31 DIAGNOSIS — Z8249 Family history of ischemic heart disease and other diseases of the circulatory system: Secondary | ICD-10-CM | POA: Diagnosis not present

## 2019-07-31 DIAGNOSIS — Z8616 Personal history of COVID-19: Secondary | ICD-10-CM | POA: Diagnosis not present

## 2019-07-31 DIAGNOSIS — U071 COVID-19: Secondary | ICD-10-CM | POA: Diagnosis not present

## 2019-07-31 DIAGNOSIS — F29 Unspecified psychosis not due to a substance or known physiological condition: Secondary | ICD-10-CM | POA: Diagnosis not present

## 2019-07-31 DIAGNOSIS — Z743 Need for continuous supervision: Secondary | ICD-10-CM | POA: Diagnosis not present

## 2019-07-31 DIAGNOSIS — F322 Major depressive disorder, single episode, severe without psychotic features: Secondary | ICD-10-CM | POA: Diagnosis not present

## 2019-07-31 LAB — RESPIRATORY PANEL BY RT PCR (FLU A&B, COVID)
Influenza A by PCR: NEGATIVE
Influenza B by PCR: NEGATIVE
SARS Coronavirus 2 by RT PCR: POSITIVE — AB

## 2019-07-31 LAB — GLUCOSE, CAPILLARY: Glucose-Capillary: 111 mg/dL — ABNORMAL HIGH (ref 70–99)

## 2019-07-31 MED ORDER — HYDROXYZINE HCL 25 MG PO TABS
25.0000 mg | ORAL_TABLET | Freq: Three times a day (TID) | ORAL | Status: DC | PRN
  Administered 2019-08-01: 25 mg via ORAL
  Filled 2019-07-31 (×3): qty 1

## 2019-07-31 MED ORDER — ALUM & MAG HYDROXIDE-SIMETH 200-200-20 MG/5ML PO SUSP
30.0000 mL | ORAL | Status: DC | PRN
  Filled 2019-07-31: qty 30

## 2019-07-31 MED ORDER — MAGNESIUM HYDROXIDE 400 MG/5ML PO SUSP
30.0000 mL | Freq: Every day | ORAL | Status: DC | PRN
  Filled 2019-07-31: qty 30

## 2019-07-31 MED ORDER — ACETAMINOPHEN 325 MG PO TABS
650.0000 mg | ORAL_TABLET | Freq: Four times a day (QID) | ORAL | Status: DC | PRN
  Administered 2019-08-01 (×2): 650 mg via ORAL
  Filled 2019-07-31 (×3): qty 2

## 2019-07-31 NOTE — H&P (Signed)
Psychiatric Admission Assessment Adult  Patient Identification: Robert Huff MRN:  [id number] Date of Evaluation:  07/31/2019 Chief Complaint:  suicidal thoughts Principal Diagnosis: Depression, major, single episode, severe ([place]) Diagnosis:  Principal Problem:   Depression, major, single episode, severe ([place])  History of Present Illness: Patient presents voluntarily for walk-in assessment.  Patient assessed by nurse practitioner.  Patient alert and oriented, answers appropriately.  Patient reports suffered an injury to his right leg while participating in [place] in August 2020 and can no longer workout as he once did.  Patient states "I have been active and athletic all of my life and now I am coping with old age and a brand-new injury."  Patient reports he is in constant pain, currently reports pain 3 out of 10 to right leg.  Patient reports "I have a tremendous amount of regret that I am feeling, I am looking at chronic pain going into the future and nothing to do with myself." Patient reports symptoms of depression including insomnia, reports night terrors and decreased hours of sleep approximately 5-6 each night.  Patient reports feeling hopeless.  Patient reports suicidal ideations with a plan to "buy a gun or jump from a parking deck."  Patient reports calling brother today to ask for a gun, brother declined.  Patient called mobile crisis today reports suicidal ideations with multiple plans. Patient unable to verbally contract for safety at this time. Patient denies history of suicide attempts and denies history of self-harm.  Patient denies homicidal ideations.  Patient denies auditory and visual hallucinations.  Patient denies alcohol and substance use. Patient seen by sports medicine MD who prescribed Prozac approximately 6 weeks ago, patient reports noncompliance with Prozac.  Patient assessed at [facility] behavioral health approximately 6 weeks ago, discharged with outpatient resources at  that time, patient unable to follow-up as he states he "works all day." Patient employed as Engineer, maintenance (IT), lives with girlfriend "life partner [name]" for the past 20 years. Inpatient psychiatric treatment recommended.    Associated Signs/Symptoms: Depression Symptoms:  depressed mood, anhedonia, insomnia, feelings of worthlessness/guilt, difficulty concentrating, hopelessness, suicidal thoughts with specific plan, loss of energy/fatigue, disturbed sleep, (Hypo) Manic Symptoms:  Irritable Mood, Anxiety Symptoms:  Excessive Worry, Psychotic Symptoms:  NA PTSD Symptoms: NA Total Time spent with patient: 1 hour  Past Psychiatric History: Denies  Is the patient at risk to self? Yes.    Has the patient been a risk to self in the past 6 months? Yes.    Has the patient been a risk to self within the distant past? Yes.    Is the patient a risk to others? No.  Has the patient been a risk to others in the past 6 months? No.  Has the patient been a risk to others within the distant past? No.   Prior Inpatient Therapy:  Denies Prior Outpatient Therapy:  Denies  Alcohol Screening:  Denies Substance Abuse History in the last 12 months:  No. Consequences of Substance Abuse: NA Previous Psychotropic Medications: Yes  Psychological Evaluations: Yes  Past Medical History: No past medical history on file. No past surgical history on file. Family History:  Family History  Problem Relation Age of Onset  . Cancer Mother        Breast cancer  . Cancer Father        Lung cancer  . Gout Brother   . Heart disease Maternal Grandmother   . Early death Maternal Grandfather   . Gout Brother   . Gout  Brother   . Gout Brother    Family Psychiatric  History: Father-Alcohol Use Disorder Tobacco Screening:   Social History:  Social History   Substance and Sexual Activity  Alcohol Use Yes  . Alcohol/week: 1.0 - 2.0 standard drinks  . Types: 1 - 2 Standard drinks or equivalent per week     Social  History   Substance and Sexual Activity  Drug Use No    Additional Social History: Marital status: (P) Long term relationship    Pain Medications: See MARs Prescriptions: See MARs Over the Counter: See MARs History of alcohol / drug use?: No history of alcohol / drug abuse                    Allergies:  No Known Allergies Lab Results: No results found for this or any previous visit (from the past 48 hour(s)).  Blood Alcohol level:  No results found for: [facility]  Metabolic Disorder Labs:  No results found for: HGBA1C, MPG No results found for: PROLACTIN Lab Results  Component Value Date   CHOL 159 01/29/2012   TRIG 41 01/29/2012   HDL 79 01/29/2012   CHOLHDL 2.0 01/29/2012   VLDL 8 01/29/2012   LDLCALC 72 01/29/2012   LDLCALC 78 05/17/2008    Current Medications: Current Outpatient Medications  Medication Sig Dispense Refill  . FLUoxetine (PROZAC) 10 MG capsule Take 1 capsule (10 mg total) by mouth daily. 30 capsule 1  . nitroGLYCERIN (NITRODUR - DOSED IN MG/24 HR) 0.2 mg/hr patch Apply 1/4th patch to affected knee, change daily (Patient not taking: Reported on 05/25/2019) 30 patch 1  . Vitamin D, Ergocalciferol, (DRISDOL) 1.25 MG (50000 UT) CAPS capsule Take 1 capsule (50,000 Units total) by mouth every 7 (seven) days. 8 capsule 0   No current facility-administered medications for this encounter.   PTA Medications: (Not in a hospital admission)   Musculoskeletal: Strength & Muscle Tone: within normal limits Gait & Station: normal Patient leans: N/A  Psychiatric Specialty Exam: Physical Exam  Nursing note and vitals reviewed. Constitutional: He is oriented to person, place, and time. He appears well-developed.  HENT:  Head: Normocephalic.  Cardiovascular: Normal rate.  Respiratory: Effort normal.  Neurological: He is alert and oriented to person, place, and time.  Psychiatric: His behavior is normal. Judgment normal. His speech is tangential. Cognition  and memory are normal. He exhibits a depressed mood. He expresses suicidal ideation. He expresses suicidal plans.    Review of Systems  Constitutional: Negative.   HENT: Negative.   Eyes: Negative.   Respiratory: Negative.   Cardiovascular: Negative.   Gastrointestinal: Negative.   Genitourinary: Negative.   Musculoskeletal: Positive for myalgias (right leg pain).  Skin: Negative.   Neurological: Negative.   Psychiatric/Behavioral: Positive for decreased concentration and sleep disturbance.    There were no vitals taken for this visit.There is no height or weight on file to calculate BMI.  General Appearance: Casual and Fairly Groomed  Eye Contact:  Good  Speech:  Clear and Coherent and Normal Rate  Volume:  Increased  Mood:  Depressed and Hopeless  Affect:  Depressed  Thought Process:  Coherent, Goal Directed and Descriptions of Associations: Intact  Orientation:  Full (Time, Place, and Person)  Thought Content:  Logical and Rumination  Suicidal Thoughts:  Yes.  with intent/plan  Homicidal Thoughts:  No  Memory:  Immediate;   Good Recent;   Good Remote;   Good  Judgement:  Fair  Insight:  Fair  Psychomotor Activity:  Normal  Concentration:  Concentration: Fair and Attention Span: Fair  Recall:  Good  Fund of Knowledge:  Good  Language:  Good  Akathisia:  No  Handed:  Right  AIMS (if indicated):     Assets:  Communication Skills Desire for Improvement Financial Resources/Insurance Housing Intimacy Leisure Time Resilience Social Support Talents/Skills Transportation Vocational/Educational  ADL's:  Intact  Cognition:  WNL  Sleep:       Treatment Plan Summary: Daily contact with patient to assess and evaluate symptoms and progress in treatment  Observation Level/Precautions:  15 minute checks  Laboratory:  CBC Chemistry Profile Folic Acid GGT HbAIC UDS UA Vitamin B-12  Psychotherapy:    Medications:    Consultations:    Discharge Concerns:     Estimated LOS:  Other:     Physician Treatment Plan for Primary Diagnosis: Depression, major, single episode, severe (HCC) Long Term Goal(s): Improvement in symptoms so as ready for discharge  Short Term Goals: Ability to identify changes in lifestyle to reduce recurrence of condition will improve, Ability to verbalize feelings will improve, Ability to disclose and discuss suicidal ideas and Ability to identify and develop effective coping behaviors will improve  Physician Treatment Plan for Secondary Diagnosis: Principal Problem:   Depression, major, single episode, severe (HCC)  Long Term Goal(s): Improvement in symptoms so as ready for discharge  Short Term Goals: Ability to verbalize feelings will improve, Ability to disclose and discuss suicidal ideas, Ability to demonstrate self-control will improve and Ability to identify and develop effective coping behaviors will improve  I certify that inpatient services furnished can reasonably be expected to improve the patient's condition.    Patrcia Dolly, FNP 1/17/20214:14 PM

## 2019-07-31 NOTE — Progress Notes (Signed)
Pt presents to [facility] accompanied by his wife after referral from mobile crisis. Pt A & O X3, with logical speech. Observed to be anxious / restless with Per pt "I have been active all my and I injured my right leg / knee while exercising. I have been depressed since then and now I am having suicidal ideation to jump off a parking deck". 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 Reports wife is very supportive. Denies HI, AVH and Pain at this time "my pain is tolerable right now". Emotional support offered to pt. Encouraged to voice concerns. Q 15 minutes safety checks initiated without self harm gestures. Pt tolerated fluids and meals when offered. Remains safe on unit.

## 2019-07-31 NOTE — BH Assessment (Signed)
Assessment Note  Robert Huff is a 59 y.o. male walk-in who voluntarily came to [facility] to be evaluated due to suicidal thoughts with a plan.  Pt states, "I want to kill myself because I can't workout and I'm in chronic pain."  Pt reports having a suicidal plan.  Pt states "I would jump off a parking deck to kill myself.  I would take pills but I don't know which ones to take."  Pt reports that he came to [facility] 6 weeks ago due to depression.  Pt states, "they sent me to an outpatient program to get treatment that cost a lot of money.  How is that going to help me?  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  No one understand what I am going through.  I just want to die."   Pt denies HI/SA/A/V-hallucinations.  Pt resides with his partner, Robert Huff.  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  Pt denies a history of inpatient/outpatient MH/SA treatment.  Pt denies a history of physical, sexual, and verbal abuse.  Patient was wearing gym clothes and appeared appropriately groomed.  Pt was alert throughout the assessment.  Patient made fair eye contact and had abnormal psychomotor activity stretching periodically stating due to pain.  Patient spoke in a normal voice without pressured speech.  Pt expressed feeling hopeless.  Pt's affect appeared dysphoric and congruent with stated mood. Pt's thought process was logical and coherent at times; but tangential at others.  Pt presented with partial insight and judgement.  Pt did not appear to be responding to internal stimuli.  Pt was not able to contract for safety.  Collateral, Georgana Curio, with [facility] contacted [facility] to give background on pt prior to his arrival).   According to Georgana Curio,  Pt was assessed with his girlfriend Robert Huff over the telephone and recommended to go to the [facility] for further evaluation.  Pt expressed having suicidal thought with  intentions of jumping off a bridge or shooting himself.   Pt injured himself a few months ago and have not been able to recover or workout.  Pt was given Prozac by his Sports Medicine Provider but stop taking it a few weeks ago due to the side effects.  Pt continue to feel helpless and his suicidal thoughts increased. Pt contacted his brother earlier today and asked for a gun.  Pt's girlfriend contacted mobile crisis seeking help.      Disposition: [facility] discussed case with [place] Provider, Letitia Libra, NP who recommends inpatient treatment.  TTS will look for inpatient placement.  Diagnosis:  F32.2 Major Depressive Disorder; Severe  Past Medical History: No past medical history on file.  No past surgical history on file.  Family History:  Family History  Problem Relation Age of Onset  . Cancer Mother        Breast cancer  . Cancer Father        Lung cancer  . Gout Brother   . Heart disease Maternal Grandmother   . Early death Maternal Grandfather   . Gout Brother   . Gout Brother   . Gout Brother     Social History:  reports that he has never smoked. He has never used smokeless tobacco. He reports current alcohol use of about 1.0 - 2.0 standard drinks of alcohol per week. He reports that he does not use drugs.  Additional Social History:  Alcohol / Drug Use Pain Medications: See MARs Prescriptions: See MARs Over the Counter: See MARs History of alcohol / drug use?: No history of alcohol / drug abuse  CIWA:   COWS:    Allergies: No Known Allergies  Home Medications: (Not in a hospital admission)   OB/GYN Status:  No LMP for male patient.  General Assessment Data Location of Assessment: [facility] Assessment Services TTS Assessment: In system Is this a Tele or Face-to-Face Assessment?: Face-to-Face Is this an Initial Assessment or a Re-assessment for this encounter?: Initial Assessment Patient Accompanied by:: N/A Language Other than English: No Living Arrangements: Other  (Comment)(in his home) What gender do you identify as?: Male Marital status: Long term relationship Living Arrangements: Spouse/significant other Can pt return to current living arrangement?: Yes Admission Status: Voluntary Is patient capable of signing voluntary admission?: Yes Referral Source: Self/Family/Friend Insurance type: BCBS  Medical Screening Exam [facility] Walk-in ONLY) Medical Exam completed: Yes  Crisis Care Plan Living Arrangements: Spouse/significant other Legal Guardian: Other:(Self) Name of Psychiatrist: None Name of Therapist: None  Education Status Is patient currently in school?: No Is the patient employed, unemployed or receiving disability?: Employed  Risk to self with the past 6 months Suicidal Ideation: Yes-Currently Present Has patient been a risk to self within the past 6 months prior to admission? : Yes Suicidal Intent: Yes-Currently Present Has patient had any suicidal intent within the past 6 months prior to admission? : Yes Is patient at risk for suicide?: Yes Suicidal Plan?: Yes-Currently Present Has patient had any suicidal plan within the past 6 months prior to admission? : Yes Specify Current Suicidal Plan: Jump off parking deck, Access to Means: Yes Specify Access to Suicidal Means: parking deck What has been your use of drugs/alcohol within the last 12 months?: denies Previous Attempts/Gestures: No How many times?: 0 Other Self Harm Risks: none Triggers for Past Attempts: None known Intentional Self Injurious Behavior: None Family Suicide History: No Recent stressful life event(s): Trauma (Comment)(Leg injury) Persecutory voices/beliefs?: No Depression: Yes Depression Symptoms: Insomnia, Fatigue, Loss of interest in usual pleasures, Feeling worthless/self pity Substance abuse history and/or treatment for substance abuse?: No Suicide prevention information given to non-admitted patients: Not applicable  Risk to Others within the past 6  months Homicidal Ideation: No Does patient have any lifetime risk of violence toward others beyond the six months prior to admission? : No Thoughts of Harm to Others: No Current Homicidal Intent: No Current Homicidal Plan: No Access to Homicidal Means: No Identified Victim: None History of harm to others?: No Assessment of Violence: None Noted Violent Behavior Description: None noted Does patient have access to weapons?: No Criminal Charges Pending?: No Does patient have a court date: No Is patient on probation?: No  Psychosis Hallucinations: None noted Delusions: None noted  Mental Status Report Appearance/Hygiene: Unremarkable Eye Contact: Fair Motor Activity: Hyperactivity(pt continually stretched due to pain) Speech: Logical/coherent, Tangential Level of Consciousness: Alert Mood: Depressed, Helpless, Preoccupied, Sad Affect: Depressed, Preoccupied, Sad Anxiety Level: Moderate Thought Processes: Coherent, Relevant, Tangential Judgement: Partial Orientation: Person, Place, Time, Appropriate for developmental age Obsessive Compulsive Thoughts/Behaviors: Moderate  Cognitive Functioning Concentration: Normal Memory: Recent Intact, Remote Intact Is patient IDD: No Insight: Poor Impulse Control: Fair Appetite: Fair Have you had any weight changes? : No Change Sleep: Decreased Total Hours of Sleep: 5 Vegetative Symptoms: None  ADLScreening [facility] Assessment Services) Patient's cognitive ability adequate to safely complete daily activities?: Yes Patient able to express need for assistance  with ADLs?: Yes Independently performs ADLs?: Yes (appropriate for developmental age)  Prior Inpatient Therapy Prior Inpatient Therapy: No  Prior Outpatient Therapy Prior Outpatient Therapy: No Does patient have an ACCT team?: No Does patient have Intensive In-House Services?  : No Does patient have [facility] services? : No Does patient have P4CC services?: No  ADL Screening  (condition at time of admission) Patient's cognitive ability adequate to safely complete daily activities?: Yes Is the patient deaf or have difficulty hearing?: No Does the patient have difficulty seeing, even when wearing glasses/contacts?: No Does the patient have difficulty concentrating, remembering, or making decisions?: No Patient able to express need for assistance with ADLs?: Yes Does the patient have difficulty dressing or bathing?: No Independently performs ADLs?: Yes (appropriate for developmental age) Does the patient have difficulty walking or climbing stairs?: No Weakness of Legs: None Weakness of Arms/Hands: None  Home Assistive Devices/Equipment Home Assistive Devices/Equipment: None    Abuse/Neglect Assessment (Assessment to be complete while patient is alone) Abuse/Neglect Assessment Can Be Completed: Yes Physical Abuse: Denies Verbal Abuse: Denies Sexual Abuse: Denies Exploitation of patient/patient's resources: Denies Values / Beliefs Cultural Requests During Hospitalization: None Spiritual Requests During Hospitalization: None Consults Spiritual Care Consult Needed: No Transition of Care Team Consult Needed: No Advance Directives (For Healthcare) Does Patient Have a Medical Advance Directive?: No Would patient like information on creating a medical advance directive?: No - Patient declined Nutrition Screen- [facility] Adult/WL/AP Patient's home diet: NPO        Disposition: [facility] discussed case with BH Provider, Berneice Heinrich, NP who recommends inpatient treatment.  TTS will look for inpatient placement.  Disposition Initial Assessment Completed for this Encounter: Yes Disposition of Patient: Admit(Per Berneice Heinrich, NP) Type of inpatient treatment program: Adult Patient refused recommended treatment: No  On Site Evaluation by:  Alexandra Lipps L. Jarrah Babich, MS, [facility], [facility] Reviewed with Physician:  Berneice Heinrich, FNP  Tysen Roesler Thayer [place], MS, [facility], [facility] 07/31/2019 4:40 PM

## 2019-08-01 ENCOUNTER — Encounter (HOSPITAL_COMMUNITY): Payer: Self-pay | Admitting: Family

## 2019-08-01 ENCOUNTER — Other Ambulatory Visit: Payer: Self-pay

## 2019-08-01 DIAGNOSIS — M79661 Pain in right lower leg: Secondary | ICD-10-CM

## 2019-08-01 LAB — COMPREHENSIVE METABOLIC PANEL
ALT: 19 U/L (ref 0–44)
AST: 22 U/L (ref 15–41)
Albumin: 4.1 g/dL (ref 3.5–5.0)
Alkaline Phosphatase: 56 U/L (ref 38–126)
Anion gap: 10 (ref 5–15)
BUN: 14 mg/dL (ref 6–20)
CO2: 24 mmol/L (ref 22–32)
Calcium: 9 mg/dL (ref 8.9–10.3)
Chloride: 103 mmol/L (ref 98–111)
Creatinine, Ser: 0.89 mg/dL (ref 0.61–1.24)
GFR calc Af Amer: >60 mL/min (ref >60)
GFR calc non Af Amer: >60 mL/min (ref >60)
Glucose, Bld: 112 mg/dL — ABNORMAL HIGH (ref 70–99)
Potassium: 3.7 mmol/L (ref 3.5–5.1)
Sodium: 137 mmol/L (ref 135–145)
Total Bilirubin: 0.8 mg/dL (ref 0.3–1.2)
Total Protein: 6.6 g/dL (ref 6.5–8.1)

## 2019-08-01 LAB — CBC WITH DIFFERENTIAL/PLATELET
Abs Immature Granulocytes: 0.03 10*3/uL (ref 0.00–0.07)
Basophils Absolute: 0 10*3/uL (ref 0.0–0.1)
Basophils Relative: 0 %
Eosinophils Absolute: 0 10*3/uL (ref 0.0–0.5)
Eosinophils Relative: 0 %
HCT: 45.5 % (ref 39.0–52.0)
Hemoglobin: 15 g/dL (ref 13.0–17.0)
Immature Granulocytes: 1 %
Lymphocytes Relative: 19 %
Lymphs Abs: 1.3 10*3/uL (ref 0.7–4.0)
MCH: 30.3 pg (ref 26.0–34.0)
MCHC: 33 g/dL (ref 30.0–36.0)
MCV: 91.9 fL (ref 80.0–100.0)
Monocytes Absolute: 0.5 10*3/uL (ref 0.1–1.0)
Monocytes Relative: 8 %
Neutro Abs: 4.7 10*3/uL (ref 1.7–7.7)
Neutrophils Relative %: 72 %
Platelets: 284 10*3/uL (ref 150–400)
RBC: 4.95 MIL/uL (ref 4.22–5.81)
RDW: 13.3 % (ref 11.5–15.5)
WBC: 6.5 10*3/uL (ref 4.0–10.5)
nRBC: 0 % (ref 0.0–0.2)

## 2019-08-01 LAB — RAPID URINE DRUG SCREEN, HOSP PERFORMED
Amphetamines: NOT DETECTED
Barbiturates: NOT DETECTED
Benzodiazepines: NOT DETECTED
Cocaine: NOT DETECTED
Opiates: NOT DETECTED
Tetrahydrocannabinol: NOT DETECTED

## 2019-08-01 LAB — TSH: TSH: 1.757 u[IU]/mL (ref 0.350–4.500)

## 2019-08-01 LAB — LIPID PANEL
Cholesterol: 186 mg/dL (ref 0–200)
HDL: 56 mg/dL (ref >40)
LDL Cholesterol: 125 mg/dL — ABNORMAL HIGH (ref 0–99)
Total CHOL/HDL Ratio: 3.3 RATIO
Triglycerides: 24 mg/dL (ref <150)
VLDL: 5 mg/dL (ref 0–40)

## 2019-08-01 LAB — HEMOGLOBIN A1C
Hgb A1c MFr Bld: 5.6 % (ref 4.8–5.6)
Mean Plasma Glucose: 114.02 mg/dL

## 2019-08-01 LAB — ETHANOL: Alcohol, Ethyl (B): <10 mg/dL (ref <10)

## 2019-08-01 LAB — MAGNESIUM: Magnesium: 2.2 mg/dL (ref 1.7–2.4)

## 2019-08-01 MED ORDER — HYDROCODONE-ACETAMINOPHEN 5-325 MG PO TABS
1.0000 | ORAL_TABLET | Freq: Once | ORAL | Status: AC
  Administered 2019-08-01: 1 via ORAL
  Filled 2019-08-01: qty 1

## 2019-08-01 MED ORDER — IBUPROFEN 800 MG PO TABS
800.0000 mg | ORAL_TABLET | Freq: Once | ORAL | Status: DC
  Filled 2019-08-01: qty 1

## 2019-08-01 NOTE — ED Notes (Signed)
Patient is angry at being discharged. Patient wants to be psychiatrically admitted. Patient was originally going to be admitted to [facility] for [facility] and after a psych consult, it was decided he was psychiatrically cleared. Patient has been refusing vital signs. Rn attempted to explain to patient that the meniscus tear that occurred in august, we cannot give him a pain medication prescription for.  Patient agitated and wanting pain medication prescription.  RN talked to patient for a considerable length of time regarding the need to follow up with orthopedist and psychiatric outpatient. Patient angry and stating he "wants to see someone right now. Today" RN explained this is not how it works in the ER. He will have to follow up outpatient.

## 2019-08-01 NOTE — Telephone Encounter (Signed)
Please go ahead with pain management referral for him - chronic lower leg pain.  Thanks!

## 2019-08-01 NOTE — Consult Note (Addendum)
[facility] ED Progress Note  08/01/2019 10:27 AM Robert Huff  MRN:  [id number]   Subjective:  Robert Huff, 59 y.o., male patient seen via tele psych by this provider, Dr. Lucianne Muss; and chart reviewed on 08/01/19.  On evaluation Robert Huff reports his main issue is pain in his leg.  Patient reports he tore some cartilage and also inflammation in the knee.  States he has been to 2 different providers who have done nothing to help.  Patient stating that he injured his leg 5 months ago and that he is in constant pain.  Reports that the pain is affecting his work and leisure activities and that there is nothing he can do without having pain.  " Anyone who is in pain all the time can lose their sanity.  I do want to hurt or kill myself, I just want the pain to stop"  Patient denies prior psychiatric history.  "I was a pretty happy guy until I started having this pain; but I don't think there is nothing that can help me in a psychiatric hospital.  I know you have my safety as a concern but that is like starting all over and there is nothing you can do to help my pain.  I need someone to help me get this pain under control so I can go back to living a normal life without my leg hurting every time I stand, try to walk.  The pain only goes a way after I have been in bed for about 2-3 hours and then it is still a dull ache."  Patient denies suicidal/self-harm/homicidal ideation, psychosis, and paranoia.  Patient gave permission to speak with his girlfriend whom he has lived with for "20+years".  Discussed with patient that would inform ED provider that he wanted a referral or a consult to assess his pain.  Also informed patient that I would put in a social work consult to set up an appointment with Dr. Ileana Roup and give referral/resources for pain management. During evaluation Robert Huff is alert/oriented x 4; calm/cooperative; and mood is congruent with affect.  He/She does not appear to be  responding to internal/external stimuli or delusional thoughts.  Patient denies suicidal/self-harm/homicidal ideation, psychosis, and paranoia.  Patient answered question appropriately.  TTS Murrell Redden LCAS) spoke with patients wife for collateral information, who stated that she felt that the patient was safe to come home and she did not feel that he was a danger to himself or anyone else.  For detailed information see note by Angela Adam, LCAS note  Principal Problem: Depression, major, single episode, severe (HCC) Diagnosis:  Principal Problem:   Depression, major, single episode, severe (HCC) Active Problems:   MDD (major depressive disorder), single episode, severe (HCC)  Total Time spent with patient: 30 minutes  Past Psychiatric History: Denies   Past Medical History: History reviewed. No pertinent past medical history. History reviewed. No pertinent surgical history. Family History:  Family History  Problem Relation Age of Onset  . Cancer Mother        Breast cancer  . Cancer Father        Lung cancer  . Gout Brother   . Heart disease Maternal Grandmother   . Early death Maternal Grandfather   . Gout Brother   . Gout Brother   . Gout Brother    Family Psychiatric  History: Denies Social History:  Social History   Substance and Sexual Activity  Alcohol Use Yes  .  Alcohol/week: 1.0 - 2.0 standard drinks  . Types: 1 - 2 Standard drinks or equivalent per week     Social History   Substance and Sexual Activity  Drug Use No    Social History   Socioeconomic History  . Marital status: Single    Spouse name: Not on file  . Number of children: Not on file  . Years of education: Not on file  . Highest education level: Not on file  Occupational History  . Not on file  Tobacco Use  . Smoking status: Never Smoker  . Smokeless tobacco: Never Used  Substance and Sexual Activity  . Alcohol use: Yes    Alcohol/week: 1.0 - 2.0 standard drinks    Types: 1 - 2  Standard drinks or equivalent per week  . Drug use: No  . Sexual activity: Yes  Other Topics Concern  . Not on file  Social History Narrative  . Not on file   Social Determinants of Health   Financial Resource Strain:   . Difficulty of Paying Living Expenses: Not on file  Food Insecurity:   . Worried About Programme researcher, broadcasting/film/videounning Out of Food in the Last Year: Not on file  . Ran Out of Food in the Last Year: Not on file  Transportation Needs:   . Lack of Transportation (Medical): Not on file  . Lack of Transportation (Non-Medical): Not on file  Physical Activity:   . Days of Exercise per Week: Not on file  . Minutes of Exercise per Session: Not on file  Stress:   . Feeling of Stress : Not on file  Social Connections:   . Frequency of Communication with Friends and Family: Not on file  . Frequency of Social Gatherings with Friends and Family: Not on file  . Attends Religious Services: Not on file  . Active Member of Clubs or Organizations: Not on file  . Attends BankerClub or Organization Meetings: Not on file  . Marital Status: Not on file    Sleep: Fair  Appetite:  Good  Current Medications: Current Facility-Administered Medications  Medication Dose Route Frequency Provider Last Rate Last Admin  . acetaminophen (TYLENOL) tablet 650 mg  650 mg Oral Q6H PRN Patrcia Dollyate, Tina L, FNP   650 mg at 08/01/19 1004  . alum & mag hydroxide-simeth (MAALOX/MYLANTA) 200-200-20 MG/5ML suspension 30 mL  30 mL Oral Q4H PRN Patrcia Dollyate, Tina L, FNP      . hydrOXYzine (ATARAX/VISTARIL) tablet 25 mg  25 mg Oral TID PRN Patrcia Dollyate, Tina L, FNP   25 mg at 08/01/19 0304  . ibuprofen (ADVIL) tablet 800 mg  800 mg Oral Once Curatolo, Adam, DO      . magnesium hydroxide (MILK OF MAGNESIA) suspension 30 mL  30 mL Oral Daily PRN Patrcia Dollyate, Tina L, FNP       Current Outpatient Medications  Medication Sig Dispense Refill  . DICLOFENAC PO Take 1 tablet by mouth as needed (pain).    Marland Kitchen. FLUoxetine (PROZAC) 10 MG capsule Take 1 capsule (10 mg total)  by mouth daily. 30 capsule 1  . ibuprofen (ADVIL) 200 MG tablet Take 200 mg by mouth every 6 (six) hours as needed for fever, headache or moderate pain.    . Multiple Vitamin (MULTIVITAMIN WITH MINERALS) TABS tablet Take 1 tablet by mouth daily.    Marland Kitchen. VITAMIN D PO Take 1 capsule by mouth daily.    . nitroGLYCERIN (NITRODUR - DOSED IN MG/24 HR) 0.2 mg/hr patch Apply 1/4th patch to affected  knee, change daily (Patient not taking: Reported on 05/25/2019) 30 patch 1  . Vitamin D, Ergocalciferol, (DRISDOL) 1.25 MG (50000 UT) CAPS capsule Take 1 capsule (50,000 Units total) by mouth every 7 (seven) days. (Patient not taking: Reported on 08/01/2019) 8 capsule 0    Lab Results:  Results for orders placed or performed during the hospital encounter of 07/31/19 (from the past 48 hour(s))  Respiratory Panel by RT PCR (Flu A&B, Covid) - Nasopharyngeal Swab     Status: Abnormal   Collection Time: 07/31/19  4:35 PM   Specimen: Nasopharyngeal Swab  Result Value Ref Range   SARS Coronavirus 2 by RT PCR POSITIVE (A) NEGATIVE    Comment: RESULT CALLED TO, READ BACK BY AND VERIFIED WITH: J,OCINZ AT 2147 ON 07/31/19 BY A,MOHAMED (NOTE) SARS-CoV-2 target nucleic acids are DETECTED. SARS-CoV-2 RNA is generally detectable in upper respiratory specimens  during the acute phase of infection. Positive results are indicative of the presence of the identified virus, but do not rule out bacterial infection or co-infection with other pathogens not detected by the test. Clinical correlation with patient history and other diagnostic information is necessary to determine patient infection status. The expected result is Negative. Fact Sheet for Patients:  https://www.moore.com/https://www.fda.gov/media/142436/download Fact Sheet for Healthcare Providers: https://www.young.biz/https://www.fda.gov/media/142435/download This test is not yet approved or cleared by the [place] FDA and  has been authorized for detection and/or diagnosis of SARS-CoV-2 by FDA under  an Emergency Use Authorization (EUA).  This EUA will remain in effect (meaning this test can be use d) for the duration of  the COVID-19 declaration under Section 564(b)(1) of the Act, 21 U.S.C. section 360bbb-3(b)(1), unless the authorization is terminated or revoked sooner.    Influenza A by PCR NEGATIVE NEGATIVE   Influenza B by PCR NEGATIVE NEGATIVE    Comment: (NOTE) The Xpert Xpress SARS-CoV-2/FLU/RSV assay is intended as an aid in  the diagnosis of influenza from Nasopharyngeal swab specimens and  should not be used as a sole basis for treatment. Nasal washings and  aspirates are unacceptable for Xpert Xpress SARS-CoV-2/FLU/RSV  testing. Fact Sheet for Patients: https://www.moore.com/https://www.fda.gov/media/142436/download Fact Sheet for Healthcare Providers: https://www.young.biz/https://www.fda.gov/media/142435/download This test is not yet approved or cleared by the [place] FDA and  has been authorized for detection and/or diagnosis of SARS-CoV-2 by  FDA under an Emergency Use Authorization (EUA). This EUA will remain  in effect (meaning this test can be used) for the duration of the  Covid-19 declaration under Section 564(b)(1) of the Act, 21  U.S.C. section 360bbb-3(b)(1), unless the authorization is  terminated or revoked. Performed at [facility], [address] [place], [place] [id number]   Urine rapid drug screen (hosp performed)not at [facility]     Status: None   Collection Time: 07/31/19  6:04 PM  Result Value Ref Range   Opiates NONE DETECTED NONE DETECTED   Cocaine NONE DETECTED NONE DETECTED   Benzodiazepines NONE DETECTED NONE DETECTED   Amphetamines NONE DETECTED NONE DETECTED   Tetrahydrocannabinol NONE DETECTED NONE DETECTED   Barbiturates NONE DETECTED NONE DETECTED    Comment: (NOTE) DRUG SCREEN FOR MEDICAL PURPOSES ONLY.  IF CONFIRMATION IS NEEDED FOR ANY PURPOSE, NOTIFY LAB WITHIN 5 DAYS. LOWEST DETECTABLE LIMITS FOR URINE DRUG SCREEN Drug Class                      Cutoff (ng/mL) Amphetamine and metabolites    1000 Barbiturate and metabolites    200 Benzodiazepine  200 Tricyclics and metabolites     300 Opiates and metabolites        300 Cocaine and metabolites        300 THC                            50 Performed at [facility], [address] [place], [place] [address]   Glucose, capillary     Status: Abnormal   Collection Time: 07/31/19  6:20 PM  Result Value Ref Range   Glucose-Capillary 111 (H) 70 - 99 mg/dL  CBC with Differential     Status: None   Collection Time: 08/01/19  7:31 AM  Result Value Ref Range   WBC 6.5 4.0 - 10.5 K/uL   RBC 4.95 4.22 - 5.81 MIL/uL   Hemoglobin 15.0 13.0 - 17.0 g/dL   HCT 21.3 08.6 - 57.8 %   MCV 91.9 80.0 - 100.0 fL   MCH 30.3 26.0 - 34.0 pg   MCHC 33.0 30.0 - 36.0 g/dL   RDW 46.9 62.9 - 52.8 %   Platelets 284 150 - 400 K/uL   nRBC 0.0 0.0 - 0.2 %   Neutrophils Relative % 72 %   Neutro Abs 4.7 1.7 - 7.7 K/uL   Lymphocytes Relative 19 %   Lymphs Abs 1.3 0.7 - 4.0 K/uL   Monocytes Relative 8 %   Monocytes Absolute 0.5 0.1 - 1.0 K/uL   Eosinophils Relative 0 %   Eosinophils Absolute 0.0 0.0 - 0.5 K/uL   Basophils Relative 0 %   Basophils Absolute 0.0 0.0 - 0.1 K/uL   Immature Granulocytes 1 %   Abs Immature Granulocytes 0.03 0.00 - 0.07 K/uL    Comment: Performed at [facility], [address], [place], [place] [address]  Comprehensive metabolic panel     Status: Abnormal   Collection Time: 08/01/19  7:31 AM  Result Value Ref Range   Sodium 137 135 - 145 mmol/L   Potassium 3.7 3.5 - 5.1 mmol/L   Chloride 103 98 - 111 mmol/L   CO2 24 22 - 32 mmol/L   Glucose, Bld 112 (H) 70 - 99 mg/dL   BUN 14 6 - 20 mg/dL   Creatinine, Ser 4.01 0.61 - 1.24 mg/dL   Calcium 9.0 8.9 - 02.7 mg/dL   Total Protein 6.6 6.5 - 8.1 g/dL   Albumin 4.1 3.5 - 5.0 g/dL   AST 22 15 - 41 U/L   ALT 19 0 - 44 U/L   Alkaline Phosphatase 56 38 - 126 U/L   Total  Bilirubin 0.8 0.3 - 1.2 mg/dL   GFR calc non Af Amer >60 >60 mL/min   GFR calc Af Amer >60 >60 mL/min   Anion gap 10 5 - 15    Comment: Performed at [facility], [address], [place], [place] [address]  Magnesium     Status: None   Collection Time: 08/01/19  7:31 AM  Result Value Ref Range   Magnesium 2.2 1.7 - 2.4 mg/dL    Comment: Performed at [facility], [address] [place], [place] [address]  TSH     Status: None   Collection Time: 08/01/19  7:32 AM  Result Value Ref Range   TSH 1.757 0.350 - 4.500 uIU/mL    Comment: Performed by a 3rd Generation assay with a functional sensitivity of <=0.01 uIU/mL. Performed at [facility], [address] [place], [place] [address]   Lipid panel     Status: Abnormal   Collection Time: 08/01/19  7:32 AM  Result Value Ref Range   Cholesterol 186 0 - 200 mg/dL   Triglycerides 24 <128 mg/dL   HDL 56 >78 mg/dL   Total CHOL/HDL Ratio 3.3 RATIO   VLDL 5 0 - 40 mg/dL   LDL Cholesterol 676 (H) 0 - 99 mg/dL    Comment:        Total Cholesterol/HDL:CHD Risk Coronary Heart Disease Risk Table                     Men   Women  1/2 Average Risk   3.4   3.3  Average Risk       5.0   4.4  2 X Average Risk   9.6   7.1  3 X Average Risk  23.4   11.0        Use the calculated Patient Ratio above and the CHD Risk Table to determine the patient's CHD Risk.        ATP III CLASSIFICATION (LDL):  <100     mg/dL   Optimal  720-947  mg/dL   Near or Above                    Optimal  130-159  mg/dL   Borderline  096-283  mg/dL   High  >662     mg/dL   Very High Performed at [facility], [address] [place], [place] [address]   Ethanol     Status: None   Collection Time: 08/01/19  7:32 AM  Result Value Ref Range   Alcohol, Ethyl (B) <10 <10 mg/dL    Comment: (NOTE) Lowest detectable limit for serum alcohol is 10 mg/dL. For medical purposes only. Performed at  [facility], [address], [place], [place] [address]   Hemoglobin A1c     Status: None   Collection Time: 08/01/19  7:32 AM  Result Value Ref Range   Hgb A1c MFr Bld 5.6 4.8 - 5.6 %    Comment: (NOTE) Pre diabetes:          5.7%-6.4% Diabetes:              >6.4% Glycemic control for   <7.0% adults with diabetes    Mean Plasma Glucose 114.02 mg/dL    Comment: Performed at [facility], [address], [place], [place] [address]    Blood Alcohol level:  Lab Results  Component Value Date   ETH <10 08/01/2019    Physical Findings: AIMS:  , ,  ,  ,    CIWA:    COWS:     Musculoskeletal: Strength & Muscle Tone: within normal limits Gait & Station: reports pain with ambulation and standing.  Did not see patient ambulate Patient leans: N/A  Psychiatric Specialty Exam: Physical Exam Vitals and nursing note reviewed.  Constitutional:      Appearance: Normal appearance.  Pulmonary:     Effort: Pulmonary effort is normal.  Neurological:     Mental Status: He is alert.  Psychiatric:        Attention and Perception: Attention and perception normal.        Mood and Affect: Mood normal.        Speech: Speech normal.        Behavior: Behavior normal. Behavior is cooperative.  Thought Content: Thought content is not paranoid or delusional. Thought content does not include homicidal or suicidal ideation.        Cognition and Memory: Cognition and memory normal.        Judgment: Judgment normal.     Review of Systems  Musculoskeletal: Positive for arthralgias and joint swelling.       Complaints of knee pain that is uncontrolled   Psychiatric/Behavioral: Agitation: Denies. Confusion: Denies. Hallucinations: Denies. Self-injury: Denies. Sleep disturbance: Denies. Suicidal ideas: Denies. Nervous/anxious: Stable         Patient reporting that he doesn't want to hurt or kill himself; states that he just wants to get the pain under control but  doesn't seem like anyone is trying to help.      All other systems reviewed and are negative.   Blood pressure 131/80, pulse 79, temperature 98 F (36.7 C), temperature source Oral, resp. rate 20, height 5\' 10"  (1.778 m), weight 78 kg, SpO2 99 %.Body mass index is 24.67 kg/m.  General Appearance: Bizarre  Eye Contact:  Good  Speech:  Clear and Coherent and Normal Rate  Volume:  Normal  Mood:  Depressed  Affect:  Appropriate and Congruent  Thought Process:  Coherent, Goal Directed and Descriptions of Associations: Intact  Orientation:  Full (Time, Place, and Person)  Thought Content:  WDL and Logical  Suicidal Thoughts:  No  Homicidal Thoughts:  No  Memory:  Immediate;   Good Recent;   Good Remote;   Good  Judgement:  Intact  Insight:  Present  Psychomotor Activity:  Normal  Concentration:  Concentration: Good and Attention Span: Good  Recall:  Good  Fund of Knowledge:  Good  Language:  Good  Akathisia:  No  Handed:  Right  AIMS (if indicated):     Assets:  Communication Skills Desire for Improvement Financial Resources/Insurance Housing Social Support Transportation  ADL's:  Intact  Cognition:  WNL  Sleep:       Recommendations: Plan Referal to social worker to set up an appointment with Dr. Vickki Hearing that patient has seen in past and a referral to pain management   Will also give resources/referral for outpatient psychiatric services    Discharge Instructions     For your behavioral health needs, you are advised to follow up with an outpatient psychiatrist.  Contact one of the following providers at your earliest opportunity to ask about scheduling an intake appointment:       [facility] [address] [place], [place] [address]      [phone]       [address] [place], [place] [address]      [phone]       Leanord Hawking, MD       [facility]      [address], [place] [address]      [phone]     Disposition:  Patient psychiatrically cleared No evidence of imminent risk to self or others at present.   Patient does not meet criteria for psychiatric inpatient admission. Supportive therapy provided about ongoing stressors. Discussed crisis plan, support from social network, calling 911, coming to the Emergency Department, and calling Suicide Hotline.   Spoke with EDP Dr. Ronnald Nian and informed  of recommendation and disposition.  States he will discharge.    Jemiah Cuadra, NP 08/01/2019, 10:27 AM

## 2019-08-01 NOTE — BH Assessment (Signed)
[facility] Assessment Progress Note  Per Shuvon Rankin, FNP, this pt does not require psychiatric hospitalization at this time.  Pt is psychiatrically cleared.  Discharge instructions include referral information for several area psychiatry providers.  Pt's nurse, Yvonna Alanis, has been notified.  Doylene Canning, MA Triage Specialist [phone]

## 2019-08-01 NOTE — ED Notes (Signed)
Patient reports the tylenol will not help his pain. Patient is requesting stronger medication

## 2019-08-01 NOTE — ED Notes (Signed)
Pt refused to be hooked up monitor. Pt states "I'm not here for all that medical stuff, nothing is wrong with me. I want to be able to move around or I will go stir crazy. I'm here for suicidal thoughts. I'm fine."

## 2019-08-01 NOTE — ED Notes (Signed)
RN called patient's wife to come pick him up

## 2019-08-01 NOTE — Progress Notes (Signed)
TOC CM spoke to pt and states he will follow up with his PCP for a referral to see Pain Management Specialist. Pt states he needs pain medication for his knee. He did follow up with Dr Penni Bombard, Ortho Specialist two months ago. Isidoro Donning RN CCM, [facility] ED TOC CM [phone]

## 2019-08-01 NOTE — BH Assessment (Signed)
[facility] Assessment Progress Note  This Clinical research associate spoke with patient's partner (girlfriend) Darl Pikes Gleason [phone] in reference to obtaining collateral information. Gleason stated she had no concerns in reference to patient's safety in reference to being discharged. Gleason reported to her knowledge that patient has never attempted self harm. Gleason reports that once patient is discharged she will assist in monitoring patient and encourage patient to follow up with counseling and recommended plan of care.

## 2019-08-01 NOTE — ED Notes (Signed)
Pt refused d/c vitals and did not sign for d/c papers.  Nurse Case manager to followup with patient

## 2019-08-01 NOTE — ED Notes (Signed)
Pt given water at this time per request. 

## 2019-08-01 NOTE — Discharge Instructions (Signed)
For your behavioral health needs, you are advised to follow up with an outpatient psychiatrist.  Contact one of the following providers at your earliest opportunity to ask about scheduling an intake appointment:       [facility]      [address]      [place], [place] [address]      [phone]       [facility]      [address]      [place], [place] [address]      [phone]       Neila Gear, MD      [facility]      [address]      [place], [place] [address]      [phone]

## 2019-08-01 NOTE — ED Provider Notes (Addendum)
[place] DEPT Provider Note   CSN: [id number] Arrival date & time: 08/01/19  0424     History Chief Complaint  Patient presents with  . Suicidal    Robert Huff is a 59 y.o. male with no significant past medical history who presents to the emergency department by PTR from behavioral health for psych hold.  The patient was seen at behavioral health for SI with a plan to jump off of a parking deck  or use a gun to shoot himself.  He reports that symptoms have been worsening since he injured his knee at [facility] in August 2020.    He voluntarily presented to [facility] after his significant other called the mobile crisis unit. The patient was assessed by TTS and inpatient admission was recommended.  He denies HI or auditory visual hallucinations.  At [facility], COVID-19 test was obtained and was positive.  The patient states that he is aware that he is a Covid positive as he had a Covid test performed on January 4 at an outside facility, [facility] after initially developing symptoms on 07/14/2019.  He reports that his COVID-19 symptoms have resolved.  He has no symptoms or complaints at this time other than chronic pain in his right knee for which he is followed by sports medicine.  The history is provided by the patient. No language interpreter was used.       History reviewed. No pertinent past medical history.  Patient Active Problem List   Diagnosis Date Noted  . Depression, major, single episode, severe ([place]) 07/31/2019  . MDD (major depressive disorder), single episode, severe ([place]) 07/31/2019  . Hemorrhagic prepatellar bursitis of left knee 07/05/2018  . Family history of BRCA1 gene positive 05/31/2016  . Right knee pain 06/11/2015  . LOW BACK PAIN, CHRONIC 03/03/2008  . HALLUX RIGIDUS 03/03/2008    History reviewed. No pertinent surgical history.     Family History  Problem Relation Age of Onset  . Cancer Mother        Breast  cancer  . Cancer Father        Lung cancer  . Gout Brother   . Heart disease Maternal Grandmother   . Early death Maternal Grandfather   . Gout Brother   . Gout Brother   . Gout Brother     Social History   Tobacco Use  . Smoking status: Never Smoker  . Smokeless tobacco: Never Used  Substance Use Topics  . Alcohol use: Yes    Alcohol/week: 1.0 - 2.0 standard drinks    Types: 1 - 2 Standard drinks or equivalent per week  . Drug use: No    Home Medications Prior to Admission medications   Medication Sig Start Date End Date Taking? Authorizing Provider  FLUoxetine (PROZAC) 10 MG capsule Take 1 capsule (10 mg total) by mouth daily. 06/27/19   Dene Gentry, MD  nitroGLYCERIN (NITRODUR - DOSED IN MG/24 HR) 0.2 mg/hr patch Apply 1/4th patch to affected knee, change daily Patient not taking: Reported on 05/25/2019 04/11/19   Dene Gentry, MD  Vitamin D, Ergocalciferol, (DRISDOL) 1.25 MG (50000 UT) CAPS capsule Take 1 capsule (50,000 Units total) by mouth every 7 (seven) days. 06/28/19   Dene Gentry, MD    Allergies    Patient has no known allergies.  Review of Systems   Review of Systems  Constitutional: Negative for appetite change and fever.  Respiratory: Negative for shortness of breath.  Cardiovascular: Negative for chest pain.  Gastrointestinal: Negative for abdominal pain, diarrhea, nausea and vomiting.  Genitourinary: Negative for dysuria.  Musculoskeletal: Positive for arthralgias, gait problem and myalgias. Negative for back pain, neck pain and neck stiffness.  Skin: Negative for rash.  Allergic/Immunologic: Negative for immunocompromised state.  Neurological: Negative for dizziness, seizures, syncope, weakness and headaches.  Psychiatric/Behavioral: Negative for confusion.    Physical Exam Updated Vital Signs BP 131/80 (BP Location: Right Arm)   Pulse 79   Temp 98 F (36.7 C) (Oral)   Resp 20   Ht 5' 10"  (1.778 m)   Wt 78 kg   SpO2 99%   BMI  24.67 kg/m   Physical Exam Vitals and nursing note reviewed.  Constitutional:      General: He is not in acute distress.    Appearance: He is well-developed. He is not ill-appearing, toxic-appearing or diaphoretic.  HENT:     Head: Normocephalic.  Eyes:     Conjunctiva/sclera: Conjunctivae normal.  Cardiovascular:     Rate and Rhythm: Normal rate and regular rhythm.     Heart sounds: No murmur.  Pulmonary:     Effort: Pulmonary effort is normal.  Abdominal:     General: There is no distension.     Palpations: Abdomen is soft.  Musculoskeletal:     Cervical back: Neck supple.     Comments: Ambulatory without difficulty.  Mild tenderness to palpation to the patellar tendon on the right leg.  Neurovascularly intact.  Skin:    General: Skin is warm and dry.  Neurological:     Mental Status: He is alert.  Psychiatric:        Attention and Perception: He does not perceive auditory or visual hallucinations.        Speech: Speech is rapid and pressured.        Thought Content: Thought content is not paranoid or delusional. Thought content includes suicidal ideation. Thought content does not include homicidal ideation. Thought content includes suicidal plan. Thought content does not include homicidal plan.     ED Results / Procedures / Treatments   Labs (all labs ordered are listed, but only abnormal results are displayed) Labs Reviewed  RESPIRATORY PANEL BY RT PCR (FLU A&B, COVID) - Abnormal; Notable for the following components:      Result Value   SARS Coronavirus 2 by RT PCR POSITIVE (*)    All other components within normal limits  GLUCOSE, CAPILLARY - Abnormal; Notable for the following components:   Glucose-Capillary 111 (*)    All other components within normal limits  RAPID URINE DRUG SCREEN, HOSP PERFORMED  CBC WITH DIFFERENTIAL/PLATELET  COMPREHENSIVE METABOLIC PANEL  PROLACTIN  TSH  LIPID PANEL  ETHANOL  HEMOGLOBIN A1C  MAGNESIUM     EKG None  Radiology No results found.  Procedures Procedures (including critical care time)  Medications Ordered in ED Medications  acetaminophen (TYLENOL) tablet 650 mg (650 mg Oral Given 08/01/19 0304)  alum & mag hydroxide-simeth (MAALOX/MYLANTA) 200-200-20 MG/5ML suspension 30 mL (has no administration in time range)  magnesium hydroxide (MILK OF MAGNESIA) suspension 30 mL (has no administration in time range)  hydrOXYzine (ATARAX/VISTARIL) tablet 25 mg (25 mg Oral Given 08/01/19 0304)    ED Course  I have reviewed the triage vital signs and the nursing notes.  Pertinent labs & imaging results that were available during my care of the patient were reviewed by me and considered in my medical decision making (see chart for details).  MDM Rules/Calculators/A&P                      59 year old male with no significant past medical history presenting to the ER from behavioral health.  The patient presented after his significant other called the mobile crisis unit for holding until Covid positive psych bed is available at [facility] in the morning.  -The patient as he reports that he tested positive for COVID-19 at an outside facility, [facility], on January 4.  Test results were not available through care everywhere.  He reports that his symptoms initially began on December 31.  Discussed with Shana Chute, given that the patient tested positive for Covid 14 days ago and symptoms have been ongoing longer.  Given CDC recommendations and mild course of the illness, he no longer should be considered under COVID-19 precautions. AC reports that this was discussed at [facility] prior to him being transferred to [facility].  Reports that there were concerns about inconsistencies in the patient's story and that since Covid test was not available in the system he will need psych inpatient treatment at a Covid unit.  -Attempted to contact [facility] to obtain prior COVID-19 test from January 4.  No answer at this time.  -Spoke with Shana Chute at [facility] to clarify expectations for the patient while he is here in the ER.  AC clarifies that the patient is not here at [facility] ER for medical clearance.  He is only here for holding until the Covid psych unit at [facility] opens this morning.  I discussed with the [facility] that if she is requesting a medical clearance then he would need to wait in the ER until all labs have been returned. States that this is a new process and the plan is to transfer him as soon as possible when the unit opens.  She does state that he may need basic labs at some point.   She also notes that she spoke with the director of the [facility] psych unit.  She is awaiting a call from the director of the unit this morning.  After she received a call from the director, she will call [facility] to inform staff of patient's bed assignment. I specifically requested she call the morning APP, PA Soto, to keep her informed of that assignment.  Patient care transferred to [place] at the end of my shift pending transfer to inpatient psych unit at [facility]. Patient presentation, ED course, and plan of care discussed with review of all pertinent labs and imaging. Please see his/her note for further details regarding further ED course and disposition.   Final Clinical Impression(s) / ED Diagnoses Final diagnoses:  Depression, major, single episode, severe ([place])  MDD (major depressive disorder), single episode, severe ([place])    Rx / DC Orders ED Discharge Orders    None       Brazos Sandoval A, PA-C 08/01/19 0729   ADDENDUM: Patient remains VOLUNTARY at this time.    Joline Maxcy A, PA-C 08/01/19 [place], [place], DO 08/01/19 2300

## 2019-08-01 NOTE — Progress Notes (Signed)
Pt being transported to [facility] related to pt testing positive for Covid-19, pt alert and oriented at the time, no issues noted.

## 2019-08-01 NOTE — ED Triage Notes (Signed)
Pt brought to ED via [facility] from [facility]. Pt presented to [facility] from home with c/o of SI. BH sent pt over to [facility] due to pt being COVID+. BH reports that pt just needs to wait over here until his bed is ready at [facility].

## 2019-08-02 LAB — PROLACTIN: Prolactin: 13.2 ng/mL (ref 4.0–15.2)

## 2019-08-03 NOTE — Addendum Note (Signed)
Addended by: Rutha Bouchard E on: 08/03/2019 04:07 PM   Modules accepted: Orders

## 2019-08-04 ENCOUNTER — Other Ambulatory Visit: Payer: Self-pay

## 2019-08-04 ENCOUNTER — Ambulatory Visit (INDEPENDENT_AMBULATORY_CARE_PROVIDER_SITE_OTHER): Payer: BC Managed Care – PPO | Admitting: Internal Medicine

## 2019-08-04 ENCOUNTER — Encounter: Payer: Self-pay | Admitting: Internal Medicine

## 2019-08-04 VITALS — BP 109/67 | HR 68 | Temp 97.6°F | Ht 70.0 in | Wt 171.0 lb

## 2019-08-04 DIAGNOSIS — G8929 Other chronic pain: Secondary | ICD-10-CM | POA: Diagnosis not present

## 2019-08-04 DIAGNOSIS — M25561 Pain in right knee: Secondary | ICD-10-CM

## 2019-08-04 NOTE — Progress Notes (Signed)
[place] for Infectious Disease  Reason for Consult: Chronic right knee pain and positive Lyme serology Referring Provider: Dr. Karlton Lemon  Assessment: I believe that his problems are related to severe chronic right knee pain causing reactive depression and suicidal ideations.  Lyme serology showed that his total IgM and IgG antibody was positive.  On Western blot his Lyme IgM was negative but IgG was strongly positive.  Although we do not have much Lyme in [place] and he has not traveled to other endemic areas of the country it is possible that his acute joint swelling last year could have been due to acute Lyme disease.  However, he does not have any evidence of active Lyme disease at this time and I do not think he needs any further evaluation for infection or treatment for infection.  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  He has been referred for pain management.  I suggested that he focus on pain management so he can resume a physical activity.  I believe that this will help his depression more than anything else.  I did have him meet with our behavioral health counselor, Jessica Priest, today as well.  She offered to arrange follow-up visits with her at [facility].  Plan: 1. Agree with pain management referral 2. Outpatient counseling for depression 3. Follow-up here as needed.  Patient Active Problem List   Diagnosis Date Noted  . Depression, major, single episode, severe ([place]) 07/31/2019  . MDD (major depressive disorder), single episode, severe ([place]) 07/31/2019  . Hemorrhagic prepatellar bursitis of left knee 07/05/2018  . Family history of BRCA1 gene positive 05/31/2016  . Right knee pain 06/11/2015  . LOW BACK PAIN, CHRONIC 03/03/2008  . HALLUX RIGIDUS 03/03/2008    Patient's Medications  New Prescriptions   No  medications on file  Previous Medications   DICLOFENAC PO    Take 1 tablet by mouth as needed (pain).   FLUOXETINE (PROZAC) 10 MG CAPSULE    Take 1 capsule (10 mg total) by mouth daily.   IBUPROFEN (ADVIL) 200 MG TABLET    Take 200 mg by mouth every 6 (six) hours as needed for fever, headache or moderate pain.   MULTIPLE VITAMIN (MULTIVITAMIN WITH MINERALS) TABS TABLET    Take 1 tablet by mouth daily.   NITROGLYCERIN (NITRODUR - DOSED IN MG/24 HR) 0.2 MG/HR PATCH    Apply 1/4th patch to affected knee, change daily   VITAMIN D PO    Take 1 capsule by mouth daily.   VITAMIN D, ERGOCALCIFEROL, (DRISDOL) 1.25 MG (50000 UT) CAPS CAPSULE    Take 1 capsule (50,000 Units total) by mouth every 7 (seven) days.  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  He was evaluated by Dr. Andreas Blower last July who felt that he had right knee pain because they have a partial tear of the rectus femoris tendon.   He underwent an MRI of his right knee in August of last year.  It revealed full-thickness cartilage loss along the  posterior weightbearing surfaces of the lateral tibial plateau and lateral femoral condyle there was a small to moderate joint effusion.  He was seen by Dr. 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  He said that he would consider a steroid injection and physical therapy.  He was seen back by Dr. Oneida Alar in December with some persistent, severe right knee pain. He had some swelling over the left patella and to a lesser degree over the right patella.  At that time it appeared that the partial tear in the right rectus femoris tendon had healed.  No further specific  treatment was indicated.  Robert Huff says that he used to workout twice a day but because of his right knee pain he has not been getting any regular exercise recently.  He has become severely depressed and started having suicidal ideations.  He was started on Prozac which he has taken sporadically.  He was evaluated at behavioral health on 08/01/2019 and released with recommendations to follow-up with outpatient psychiatry.  So far, no arrangements have been made for outpatient evaluation.  He had recent blood work for Lyme after giving a history of tick exposure last year.  He recalls that he found a nonengorged tick crawling up his left leg.  It was not attached.  He has never lived outside of [place].  Review of Systems: Review of Systems  Constitutional: Positive for malaise/fatigue. Negative for chills, diaphoresis, fever and weight loss.  Respiratory: Negative for cough and shortness of breath.   Cardiovascular: Negative for chest pain.  Gastrointestinal: Negative for abdominal pain, diarrhea, nausea and vomiting.  Musculoskeletal: Positive for joint pain. Negative for back pain.  Skin: Negative for rash.  Neurological: Negative for headaches.  Psychiatric/Behavioral: Positive for depression and suicidal ideas.      History reviewed. No pertinent past medical history.  Social History   Tobacco Use  . Smoking status: Never Smoker  . Smokeless tobacco: Never Used  Substance Use Topics  . Alcohol use: Yes    Alcohol/week: 1.0 - 2.0 standard drinks    Types: 1 - 2 Standard drinks or equivalent per week  . Drug use: No    Family History  Problem Relation Age of Onset  . Cancer Mother        Breast cancer  . Cancer Father        Lung cancer  . Gout Brother   . Heart disease Maternal Grandmother   . Early death Maternal Grandfather   . Gout Brother   . Gout Brother   . Gout Brother    No Known Allergies  OBJECTIVE: Vitals:   08/04/19 0851  BP: 109/67  Pulse:  68  Temp: 97.6 F (36.4 C)  SpO2: 99%  Weight: 171 lb (77.6 kg)  Height: 5' 10"  (1.778 m)   Body mass index is 24.54 kg/m.   Physical Exam Constitutional:      Comments: He is calm, pleasant and in no distress.  He is accompanied by his long-term partner, [name] Gleason.  Cardiovascular:     Rate and Rhythm: Normal rate and regular rhythm.     Heart sounds: No murmur.  Pulmonary:     Effort: Pulmonary effort is normal.     Breath sounds: Normal breath sounds.  Abdominal:     Palpations: Abdomen is soft.     Tenderness: There is no abdominal tenderness.  Musculoskeletal:  General: No swelling or tenderness. Normal range of motion.     Comments: He has no unusual swelling, redness or warmth of his joints.  He does have some mild crepitus with range of motion of his right knee.  Lymphadenopathy:     Cervical: No cervical adenopathy.     Upper Body:     Right upper body: No supraclavicular, axillary or epitrochlear adenopathy.     Left upper body: No supraclavicular, axillary or epitrochlear adenopathy.  Skin:    Findings: No rash.  Neurological:     General: No focal deficit present.  Psychiatric:     Comments: Flat affect.     Microbiology: Recent Results (from the past 240 hour(s))  Respiratory Panel by RT PCR (Flu A&B, Covid) - Nasopharyngeal Swab     Status: Abnormal   Collection Time: 07/31/19  4:35 PM   Specimen: Nasopharyngeal Swab  Result Value Ref Range Status   SARS Coronavirus 2 by RT PCR POSITIVE (A) NEGATIVE Final    Comment: RESULT CALLED TO, READ BACK BY AND VERIFIED WITH: J,OCINZ AT 2147 ON 07/31/19 BY A,MOHAMED (NOTE) SARS-CoV-2 target nucleic acids are DETECTED. SARS-CoV-2 RNA is generally detectable in upper respiratory specimens  during the acute phase of infection. Positive results are indicative of the presence of the identified virus, but do not rule out bacterial infection or co-infection with other pathogens not detected by the test.  Clinical correlation with patient history and other diagnostic information is necessary to determine patient infection status. The expected result is Negative. Fact Sheet for Patients:  PinkCheek.be Fact Sheet for Healthcare Providers: GravelBags.it This test is not yet approved or cleared by the [place] FDA and  has been authorized for detection and/or diagnosis of SARS-CoV-2 by FDA under an Emergency Use Authorization (EUA).  This EUA will remain in effect (meaning this test can be use d) for the duration of  the COVID-19 declaration under Section 564(b)(1) of the Act, 21 U.S.C. section 360bbb-3(b)(1), unless the authorization is terminated or revoked sooner.    Influenza A by PCR NEGATIVE NEGATIVE Final   Influenza B by PCR NEGATIVE NEGATIVE Final    Comment: (NOTE) The Xpert Xpress SARS-CoV-2/FLU/RSV assay is intended as an aid in  the diagnosis of influenza from Nasopharyngeal swab specimens and  should not be used as a sole basis for treatment. Nasal washings and  aspirates are unacceptable for Xpert Xpress SARS-CoV-2/FLU/RSV  testing. Fact Sheet for Patients: PinkCheek.be Fact Sheet for Healthcare Providers: GravelBags.it This test is not yet approved or cleared by the [place] FDA and  has been authorized for detection and/or diagnosis of SARS-CoV-2 by  FDA under an Emergency Use Authorization (EUA). This EUA will remain  in effect (meaning this test can be used) for the duration of the  Covid-19 declaration under Section 564(b)(1) of the Act, 21  U.S.C. section 360bbb-3(b)(1), unless the authorization is  terminated or revoked. Performed at [facility], [place] [address], [place], [place] [address]     Michel Bickers, [facility] [phone] pager   [phone]  cell 08/04/2019, 11:41 AM

## 2019-08-29 ENCOUNTER — Other Ambulatory Visit: Payer: Self-pay | Admitting: Family Medicine

## 2019-08-30 NOTE — Telephone Encounter (Signed)
Called patient and spoke with him on the phone.  He has been meeting with psychologist weekly and is taking his SSRI without a problem.  He reports anxiety and concern related to his pain in his shin and knee.  Weird feelings he gets are not localized to a specific dermatome - in arm, sometimes in chest.  No weakness, changes in speech though he feels like he has a lump in his throat and has had to wear reading glasses past 3 months.  He has not established with a PCP - not interested at this time.  Asked him if he'd like to see neurology for evaluation but declined at this time - will let me know in future if he'd like to go ahead with this.

## 2019-09-26 ENCOUNTER — Telehealth: Payer: Self-pay | Admitting: Family Medicine

## 2019-09-26 NOTE — Telephone Encounter (Signed)
See note for details.  Patient declined psychiatrist referral but has been meeting with psychologist.

## 2019-09-26 NOTE — Telephone Encounter (Signed)
-----   Message from [facility], LAT sent at 09/26/2019 11:10 AM EDT ----- Regarding: FW: phone message Update on Zak and the referral his wife requested. ----- Message ----- From: Lizbeth Bark Sent: 09/26/2019  10:57 AM EDT To: Rutha Bouchard, LAT Subject: FW: phone message                              I just spoke to Josip, at first he said I want whatever susan wants. I let him know we needed the request from him then he asked me what susan wanted him to get a referral for. I read to him the message below and he said no lets pass on that referral. I let him know she can't call requesting referrals without a HIPPA signed.  ----- Message ----- From: Lizbeth Bark Sent: 09/26/2019   8:10 AM EDT To: Rutha Bouchard, LAT Subject: FW: phone message                              This pt left a vm asking for a status on this. ----- Message ----- From: Rutha Bouchard, LAT Sent: 09/22/2019   2:12 PM EDT To: Lizbeth Bark Subject: RE: phone message                              I'll pass it along to [facility] when he's back in the office tomorrow. We did give him the info for Behavioral Health but I'm not sure how we left it with him seeing a psychiatrist. Is she sure he needs a referral? ----- Message ----- From: Lizbeth Bark Sent: 09/22/2019   1:43 PM EST To: Rutha Bouchard, LAT Subject: phone message                                  Darl Pikes pt wife called asking for a referral for a psychiatrist for depression. He is still struggling regarding his injury. I asked to speak to him, but he wasn't available. I know we had some type of a request from him before regarding this, but didn't know if Damaria Stofko ever did refer him.

## 2019-09-27 NOTE — Telephone Encounter (Signed)
Calling to set up appointment - see notes.

## 2019-09-28 ENCOUNTER — Other Ambulatory Visit: Payer: Self-pay

## 2019-09-28 ENCOUNTER — Encounter: Payer: Self-pay | Admitting: Family Medicine

## 2019-09-28 ENCOUNTER — Ambulatory Visit (INDEPENDENT_AMBULATORY_CARE_PROVIDER_SITE_OTHER): Payer: BC Managed Care – PPO | Admitting: Family Medicine

## 2019-09-28 VITALS — BP 102/68 | Ht 70.0 in | Wt 170.0 lb

## 2019-09-28 DIAGNOSIS — G8929 Other chronic pain: Secondary | ICD-10-CM | POA: Diagnosis not present

## 2019-09-28 DIAGNOSIS — M25561 Pain in right knee: Secondary | ICD-10-CM

## 2019-09-28 MED ORDER — METHYLPREDNISOLONE ACETATE 40 MG/ML IJ SUSP
40.0000 mg | Freq: Once | INTRAMUSCULAR | Status: AC
  Administered 2019-09-28: 40 mg via INTRA_ARTICULAR

## 2019-09-28 NOTE — Patient Instructions (Signed)
We will send the fluid to the lab for analysis. We gave you a cortisone shot today in this knee. It's likely I'll send you to the rheumatologist after we get the labwork back but we will discuss this.

## 2019-09-28 NOTE — Progress Notes (Signed)
PCP: [place]  Subjective:   HPI: Patient is a 59 y.o. male here for right knee/shin pain.  Patient returned today after discussion (see patient emails) about his continued right knee, proximal shin pain. He reports also recently had what he feels was a gouty flare in his foot that resolved on its own.   Left knee with some soreness as well. But more recently with left hand swelling and redness without increase in activity level, trauma. His right knee is swollen today. Went to ID given his lyme titers and not felt to be contributing to his current symptoms. We had decided to go ahead with steroid injection into right knee prior to today's visit.  History reviewed. No pertinent past medical history.  Current Outpatient Medications on File Prior to Visit  Medication Sig Dispense Refill  . DICLOFENAC PO Take 1 tablet by mouth as needed (pain).    Marland Kitchen FLUoxetine (PROZAC) 10 MG capsule TAKE 1 CAPSULE BY MOUTH EVERY DAY 90 capsule 1  . ibuprofen (ADVIL) 200 MG tablet Take 200 mg by mouth every 6 (six) hours as needed for fever, headache or moderate pain.    . Multiple Vitamin (MULTIVITAMIN WITH MINERALS) TABS tablet Take 1 tablet by mouth daily.    . nitroGLYCERIN (NITRODUR - DOSED IN MG/24 HR) 0.2 mg/hr patch Apply 1/4th patch to affected knee, change daily (Patient not taking: Reported on 05/25/2019) 30 patch 1  . VITAMIN D PO Take 1 capsule by mouth daily.    . Vitamin D, Ergocalciferol, (DRISDOL) 1.25 MG (50000 UT) CAPS capsule Take 1 capsule (50,000 Units total) by mouth every 7 (seven) days. (Patient not taking: Reported on 08/01/2019) 8 capsule 0   No current facility-administered medications on file prior to visit.    History reviewed. No pertinent surgical history.  No Known Allergies  Social History   Socioeconomic History  . Marital status: Single    Spouse name: Not on file  . Number of children: Not on file  . Years of education: Not on file  . Highest  education level: Not on file  Occupational History  . Not on file  Tobacco Use  . Smoking status: Never Smoker  . Smokeless tobacco: Never Used  Substance and Sexual Activity  . Alcohol use: Yes    Alcohol/week: 1.0 - 2.0 standard drinks    Types: 1 - 2 Standard drinks or equivalent per week  . Drug use: No  . Sexual activity: Yes  Other Topics Concern  . Not on file  Social History Narrative  . Not on file   Social Determinants of Health   Financial Resource Strain:   . Difficulty of Paying Living Expenses:   Food Insecurity:   . Worried About Charity fundraiser in the Last Year:   . Arboriculturist in the Last Year:   Transportation Needs:   . Film/video editor (Medical):   Marland Kitchen Lack of Transportation (Non-Medical):   Physical Activity:   . Days of Exercise per Week:   . Minutes of Exercise per Session:   Stress:   . Feeling of Stress :   Social Connections:   . Frequency of Communication with Friends and Family:   . Frequency of Social Gatherings with Friends and Family:   . Attends Religious Services:   . Active Member of Clubs or Organizations:   . Attends Archivist Meetings:   Marland Kitchen Marital Status:   Intimate Partner Violence:   .  Fear of Current or Ex-Partner:   . Emotionally Abused:   Marland Kitchen Physically Abused:   . Sexually Abused:     Family History  Problem Relation Age of Onset  . Cancer Mother        Breast cancer  . Cancer Father        Lung cancer  . Gout Brother   . Heart disease Maternal Grandmother   . Early death Maternal Grandfather   . Gout Brother   . Gout Brother   . Gout Brother     BP 102/68   Ht 5\' 10"  (1.778 m)   Wt 170 lb (77.1 kg)   BMI 24.39 kg/m   Review of Systems: See HPI above.     Objective:  Physical Exam:  Gen: NAD, comfortable in exam room  Left hand: Notable warmth, swelling of 2nd-4th MCP joints compared to right hand.  No bruising, other deformity. No current TTP of these digits or joints. Mildly  limited flexion at MCPs but full extension. NVI distally.  Right knee: Moderate effusion.  Well developed musculature.  Minimal warmth.  No redness, other deformity. No joint line TTP. ROM 0 - 120 degrees.  Normal strength. Negative ant/post drawers. Negative valgus/varus testing. Negative lachmans. NV intact distally.   Assessment & Plan:  1. Right knee pain - with effusion.  MRI performed about 6 months ago overall reassuring - had joint effusion, lateral meniscus tear but clinically this was not causing his symptoms.  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  He had labwork including uric acid, CBC with diff, CMP, TSH, sed rate, ferritin, monospot, lyme titers.  Saw ID given his lyme titers - not felt to be related to his presentation.  Went ahead with aspiration/injection of his right knee today and sending fluid for analysis - was cloudy.  With his MCP synovitis of his left hand today suspect rheumatologic disorder.  After informed written consent timeout was performed, patient was lying supine on exam table.  Right knee was prepped with alcohol swab.  Utilizing superolateral approach, 3 mL of bupivicaine was used for local anesthesia.  Then using an 18g needle on 60cc syringe,48 mL of cloudy straw-colored fluid was aspirated from right knee.  Knee was then injected with 3:1 bupivicaine:depomedrol.  Patient tolerated procedure well without immediate complications.

## 2019-09-29 LAB — SYNOVIAL FLUID, CELL COUNT
Eos, Fluid: 0 %
Lining Cells, Synovial: 0 %
Lymphs, Fluid: 28 %
Macrophages Fld: 4 %
Nuc cell # Fld: 11870 cells/uL — ABNORMAL HIGH (ref 0–200)
Polys, Fluid: 68 %
RBC, Fluid: 3000 /uL

## 2019-09-30 ENCOUNTER — Other Ambulatory Visit: Payer: Self-pay

## 2019-09-30 DIAGNOSIS — M130 Polyarthritis, unspecified: Secondary | ICD-10-CM

## 2019-09-30 NOTE — Telephone Encounter (Signed)
Please refer patient to rheumatology for polyarthritis (left hand MCPs, right knee, great toe) with inflammatory synovial fluid of his knee but no crystals, normal uric acid if they ask.  Thanks!

## 2019-10-02 LAB — BODY FLUID CULTURE

## 2019-10-05 NOTE — Addendum Note (Signed)
Addended by: Rutha Bouchard E on: 10/05/2019 04:31 PM   Modules accepted: Orders

## 2019-10-10 NOTE — Telephone Encounter (Signed)
Response given on patient message from 3/28

## 2019-10-18 DIAGNOSIS — R5382 Chronic fatigue, unspecified: Secondary | ICD-10-CM | POA: Diagnosis not present

## 2019-10-18 DIAGNOSIS — M2550 Pain in unspecified joint: Secondary | ICD-10-CM | POA: Diagnosis not present

## 2019-11-02 DIAGNOSIS — F322 Major depressive disorder, single episode, severe without psychotic features: Secondary | ICD-10-CM | POA: Diagnosis not present

## 2019-11-02 DIAGNOSIS — F411 Generalized anxiety disorder: Secondary | ICD-10-CM | POA: Diagnosis not present

## 2019-11-16 DIAGNOSIS — F332 Major depressive disorder, recurrent severe without psychotic features: Secondary | ICD-10-CM | POA: Diagnosis not present

## 2019-11-16 DIAGNOSIS — F411 Generalized anxiety disorder: Secondary | ICD-10-CM | POA: Diagnosis not present

## 2019-11-25 DIAGNOSIS — A6920 Lyme disease, unspecified: Secondary | ICD-10-CM | POA: Diagnosis not present

## 2019-11-25 DIAGNOSIS — R5382 Chronic fatigue, unspecified: Secondary | ICD-10-CM | POA: Diagnosis not present

## 2019-11-25 DIAGNOSIS — M2550 Pain in unspecified joint: Secondary | ICD-10-CM | POA: Diagnosis not present

## 2019-12-14 ENCOUNTER — Other Ambulatory Visit: Payer: Self-pay

## 2019-12-14 ENCOUNTER — Encounter: Payer: Self-pay | Admitting: Family Medicine

## 2019-12-14 ENCOUNTER — Ambulatory Visit (INDEPENDENT_AMBULATORY_CARE_PROVIDER_SITE_OTHER): Payer: BC Managed Care – PPO | Admitting: Family Medicine

## 2019-12-14 VITALS — BP 128/82 | Ht 70.0 in | Wt 182.0 lb

## 2019-12-14 DIAGNOSIS — M25561 Pain in right knee: Secondary | ICD-10-CM

## 2019-12-14 DIAGNOSIS — G8929 Other chronic pain: Secondary | ICD-10-CM | POA: Diagnosis not present

## 2019-12-14 MED ORDER — METHYLPREDNISOLONE ACETATE 40 MG/ML IJ SUSP
40.0000 mg | Freq: Once | INTRAMUSCULAR | Status: AC
  Administered 2019-12-14: 40 mg via INTRA_ARTICULAR

## 2019-12-14 NOTE — Progress Notes (Addendum)
PCP: [name]  Subjective:   HPI: Patient is a 59 y.o. male here for right knee/shin pain.  3/17: Patient returned today after discussion (see patient emails) about his continued right knee, proximal shin pain. He reports also recently had what he feels was a gouty flare in his foot that resolved on its own.   Left knee with some soreness as well. But more recently with left hand swelling and redness without increase in activity level, trauma. His right knee is swollen today. Went to ID given his lyme titers and not felt to be contributing to his current symptoms. We had decided to go ahead with steroid injection into right knee prior to today's visit.  6/2: Patient returned today for right knee steroid injection after a telephone conversation yesterday. He's continued to have pain anterior right knee just below patellar tendon area. MRI of the area was negative for stress fracture. He did have relief with a steroid injection previously. Completed antibiotic treatment for Lyme disease with rheumatology. Thursday had a lot of pain at night though he also reports he's restless at night. Still dealing with depression related to inability to exercise and his pain. Taking prozac.  Has spoken with psychologists but didn't feel it helped him - interested in speaking with someone more focused on sports psychology to see if that would provide benefit given this stems from what he feels is addiction to exercise and inability to exercise is what is causing his depression.  History reviewed. No pertinent past medical history.  Current Outpatient Medications on File Prior to Visit  Medication Sig Dispense Refill  . DICLOFENAC PO Take 1 tablet by mouth as needed (pain).    Marland Kitchen FLUoxetine (PROZAC) 10 MG capsule TAKE 1 CAPSULE BY MOUTH EVERY DAY 90 capsule 1  . ibuprofen (ADVIL) 200 MG tablet Take 200 mg by mouth every 6 (six) hours as needed for fever, headache or moderate pain.    .  Multiple Vitamin (MULTIVITAMIN WITH MINERALS) TABS tablet Take 1 tablet by mouth daily.    . nitroGLYCERIN (NITRODUR - DOSED IN MG/24 HR) 0.2 mg/hr patch Apply 1/4th patch to affected knee, change daily (Patient not taking: Reported on 05/25/2019) 30 patch 1  . VITAMIN D PO Take 1 capsule by mouth daily.    . Vitamin D, Ergocalciferol, (DRISDOL) 1.25 MG (50000 UT) CAPS capsule Take 1 capsule (50,000 Units total) by mouth every 7 (seven) days. (Patient not taking: Reported on 08/01/2019) 8 capsule 0   No current facility-administered medications on file prior to visit.    History reviewed. No pertinent surgical history.  No Known Allergies  Social History   Socioeconomic History  . Marital status: Single    Spouse name: Not on file  . Number of children: Not on file  . Years of education: Not on file  . Highest education level: Not on file  Occupational History  . Not on file  Tobacco Use  . Smoking status: Never Smoker  . Smokeless tobacco: Never Used  Substance and Sexual Activity  . Alcohol use: Yes    Alcohol/week: 1.0 - 2.0 standard drinks    Types: 1 - 2 Standard drinks or equivalent per week  . Drug use: No  . Sexual activity: Yes  Other Topics Concern  . Not on file  Social History Narrative  . Not on file   Social Determinants of Health   Financial Resource Strain:   . Difficulty of Paying Living Expenses:  Food Insecurity:   . Worried About Charity fundraiser in the Last Year:   . Arboriculturist in the Last Year:   Transportation Needs:   . Film/video editor (Medical):   Marland Kitchen Lack of Transportation (Non-Medical):   Physical Activity:   . Days of Exercise per Week:   . Minutes of Exercise per Session:   Stress:   . Feeling of Stress :   Social Connections:   . Frequency of Communication with Friends and Family:   . Frequency of Social Gatherings with Friends and Family:   . Attends Religious Services:   . Active Member of Clubs or Organizations:   .  Attends Archivist Meetings:   Marland Kitchen Marital Status:   Intimate Partner Violence:   . Fear of Current or Ex-Partner:   . Emotionally Abused:   Marland Kitchen Physically Abused:   . Sexually Abused:     Family History  Problem Relation Age of Onset  . Cancer Mother        Breast cancer  . Cancer Father        Lung cancer  . Gout Brother   . Heart disease Maternal Grandmother   . Early death Maternal Grandfather   . Gout Brother   . Gout Brother   . Gout Brother     BP 128/82   Ht 5\' 10"  (1.778 m)   Wt 182 lb (82.6 kg)   BMI 26.11 kg/m   Review of Systems: See HPI above.     Objective:  Physical Exam:  Gen: NAD, comfortable in exam room  Right knee: Minimal effusion.  No gross deformity, ecchymoses. No TTP. FROM.   Assessment & Plan:  1. Right knee pain - effusion now minimal on exam and by ultrasound.  S/p antibiotic treatment for lyme disease.  MRI showed joint effusion and lateral meniscus tear but exam not consistent with meniscus tear as cause of his pain.  Does have degenerative changes noted which along with lyme were causing his knee pain.  Repeated steroid injection today.  Consider MRI of tib/fib if not improving.  After informed written consent timeout was performed, patient was seated on exam table. Right knee was prepped with alcohol swab and utilizing anteromedial approach, patient's right knee was injected intraarticularly with 3:1 bupivicaine: depomedrol. Patient tolerated the procedure well without immediate complications.  Addendum 6/15:  Discussed with patient via patient email.  Not improving following injection, adequate treatment for lyme disease.  Prior MRI of knee without evidence proximal tibia stress fracture but his pain persists, worse with weight bearing.  Prior ultrasounds have been reassuring as well of his proximal tibia and fibula.  Advised previously we could assess further with MRI of tibia/fibula - he has been reluctant in past but would like  to go ahead with this at this time - would assess if window of stress reaction/stress fracture did not go distal enough and he's having referred pain more proximal.

## 2019-12-15 ENCOUNTER — Other Ambulatory Visit: Payer: Self-pay

## 2019-12-16 ENCOUNTER — Encounter: Payer: Self-pay | Admitting: Family Medicine

## 2019-12-16 ENCOUNTER — Ambulatory Visit (INDEPENDENT_AMBULATORY_CARE_PROVIDER_SITE_OTHER): Payer: BC Managed Care – PPO | Admitting: Family Medicine

## 2019-12-16 VITALS — BP 118/68 | HR 75 | Temp 97.8°F | Ht 70.0 in | Wt 182.7 lb

## 2019-12-16 DIAGNOSIS — G479 Sleep disorder, unspecified: Secondary | ICD-10-CM | POA: Diagnosis not present

## 2019-12-16 DIAGNOSIS — F322 Major depressive disorder, single episode, severe without psychotic features: Secondary | ICD-10-CM | POA: Diagnosis not present

## 2019-12-16 MED ORDER — FLUOXETINE HCL 20 MG PO TABS: 20.0000 mg | ORAL_TABLET | Freq: Every day | ORAL | 5 refills | Status: DC

## 2019-12-16 NOTE — Patient Instructions (Signed)

## 2019-12-16 NOTE — Progress Notes (Signed)
Subjective:     Patient ID: Robert Huff, male   DOB: 01-07-1961, 59 y.o.   MRN: [id number]  HPI Kelcey is seen to establish care.  He is generally very healthy.  He relates that he has been avid exerciser most of his life and especially with Cross[employer] has done triathlons and was working out regularly and then around August he developed right knee pain.  He was told he had a "cartilage tear ".  He has been seen regularly by sports medicine.  He had his third cortisone injection yesterday.  He has been very limited in what he can do exercise wise.  He states he has had tremendous difficulties with depression since the injury.  In fact, he had some suicidal ideation back in the fall and was placed on Prozac 10 mg daily and did see some improvement but still has sleep disturbance.  He has feelings of hopelessness.  He has low motivation.  He feels he has some ongoing depression but no active suicidal ideation.  He states he has seen 3 different therapist for cognitive behavioral therapy but did not feel any of those helped.  He has had multiple labs recently and these were reviewed.  Thyroid functions been normal.  He is taking some melatonin for sleep but has some hangover sleepiness  He has had no other chronic medical problems.  No prior surgeries.  Never smoked.  No alcohol.  Works as a Engineer, maintenance (IT).  Still works full-time but has had difficulty concentrating secondary to issues.  Also states that his girlfriend with whom he has lived for 20 years was diagnosed recently with breast cancer.  Family history:   Mom 18 and doing relatively well except for some arthritis issues.  Father died of lung cancer at a fairly early age.  He has 4 brothers and states that 3 of them have gout  Past Medical History:  Diagnosis Date  . Arthritis   . Depression    No past surgical history on file.  reports that he has never smoked. He has never used smokeless tobacco. He reports current alcohol use of about 1.0 - 2.0  standard drinks of alcohol per week. He reports that he does not use drugs. family history includes Cancer in his father and mother; Early death in his father and maternal grandfather; Gout in his brother, brother, brother, and brother; Heart disease in his maternal grandmother. No Known Allergies   Review of Systems  Constitutional: Negative for appetite change and unexpected weight change.  Respiratory: Negative for cough and shortness of breath.   Cardiovascular: Negative for chest pain.  Psychiatric/Behavioral: Positive for dysphoric mood and sleep disturbance. Negative for agitation.       Objective:   Physical Exam Vitals reviewed.  Constitutional:      Appearance: Normal appearance.  Cardiovascular:     Rate and Rhythm: Normal rate and regular rhythm.  Pulmonary:     Effort: Pulmonary effort is normal.     Breath sounds: Normal breath sounds.  Musculoskeletal:     Right lower leg: No edema.     Left lower leg: No edema.     Comments: He has brace on right knee  Neurological:     Mental Status: He is alert.  Psychiatric:     Comments: PHQ-9 equals 23        Assessment:     #1 right knee injury several months ago which has created tremendous stress for him  #2 severe depression.  PHQ 9 score today of 23.  Denies any active suicidal ideation at this time.     Plan:     -We strongly advise increasing his fluoxetine to 20 mg daily -He has not interested in further counseling intervention at this time.  New Rx for prozac at 20 mg sent to pharmacy. -recommend one month follow up.   Repeat PHQ-9 at that time.  If not improved, consider addition of low dose Wellbutrin.   Kristian Covey MD [facility]

## 2019-12-27 ENCOUNTER — Other Ambulatory Visit: Payer: Self-pay | Admitting: *Deleted

## 2019-12-27 DIAGNOSIS — G8929 Other chronic pain: Secondary | ICD-10-CM

## 2019-12-27 DIAGNOSIS — M25561 Pain in right knee: Secondary | ICD-10-CM

## 2019-12-27 NOTE — Telephone Encounter (Signed)
Placed addendum on his last office note.  Will go ahead with MRI without contrast of tibia/fibula to assess for stress fracture of tibia.  He may need radiographs of tib/fib before approval.

## 2019-12-29 ENCOUNTER — Encounter: Payer: Self-pay | Admitting: Family Medicine

## 2020-01-04 ENCOUNTER — Encounter: Payer: Self-pay | Admitting: Family Medicine

## 2020-01-26 ENCOUNTER — Other Ambulatory Visit: Payer: Self-pay | Admitting: *Deleted

## 2020-01-26 ENCOUNTER — Telehealth: Payer: Self-pay | Admitting: Family Medicine

## 2020-01-26 ENCOUNTER — Other Ambulatory Visit: Payer: Self-pay | Admitting: Family Medicine

## 2020-01-26 ENCOUNTER — Ambulatory Visit: Payer: BC Managed Care – PPO | Admitting: Family Medicine

## 2020-01-26 DIAGNOSIS — F322 Major depressive disorder, single episode, severe without psychotic features: Secondary | ICD-10-CM

## 2020-01-26 MED ORDER — FLUOXETINE HCL 20 MG PO TABS: 20.0000 mg | ORAL_TABLET | Freq: Every day | ORAL | 5 refills | Status: DC

## 2020-01-26 NOTE — Telephone Encounter (Signed)
Patient states 3 weeks ago Dr Caryl Never doubled his Prozac.  He states he will run out after today and needs his medication refilled.    Pharmacy- [facility] [address]

## 2020-01-27 NOTE — Telephone Encounter (Signed)
Please advise 

## 2020-01-27 NOTE — Telephone Encounter (Signed)
Refill Prozac 20 mg po qd #90 with 3 refills.

## 2020-01-27 NOTE — Telephone Encounter (Signed)
Asher Muir nurse supervisor filled yesterday will resend for 90 days next time

## 2020-01-30 ENCOUNTER — Other Ambulatory Visit: Payer: Self-pay

## 2020-01-30 ENCOUNTER — Ambulatory Visit
Admission: RE | Admit: 2020-01-30 | Discharge: 2020-01-30 | Disposition: A | Discharge status: Home / Self Care | Payer: BC Managed Care – PPO | Source: Ambulatory Visit | Attending: Family Medicine | Admitting: Family Medicine

## 2020-01-30 ENCOUNTER — Ambulatory Visit (HOSPITAL_COMMUNITY)
Admission: EM | Admit: 2020-01-30 | Discharge: 2020-01-30 | Disposition: A | Discharge status: Home / Self Care | Payer: BC Managed Care – PPO | Attending: Psychiatry | Admitting: Psychiatry

## 2020-01-30 ENCOUNTER — Ambulatory Visit (INDEPENDENT_AMBULATORY_CARE_PROVIDER_SITE_OTHER): Payer: BC Managed Care – PPO | Admitting: Family Medicine

## 2020-01-30 DIAGNOSIS — M25561 Pain in right knee: Secondary | ICD-10-CM

## 2020-01-30 DIAGNOSIS — F322 Major depressive disorder, single episode, severe without psychotic features: Secondary | ICD-10-CM | POA: Diagnosis not present

## 2020-01-30 DIAGNOSIS — M25761 Osteophyte, right knee: Secondary | ICD-10-CM | POA: Diagnosis not present

## 2020-01-30 DIAGNOSIS — F332 Major depressive disorder, recurrent severe without psychotic features: Secondary | ICD-10-CM | POA: Diagnosis not present

## 2020-01-30 DIAGNOSIS — M1711 Unilateral primary osteoarthritis, right knee: Secondary | ICD-10-CM | POA: Diagnosis not present

## 2020-01-30 DIAGNOSIS — G8929 Other chronic pain: Secondary | ICD-10-CM

## 2020-01-30 DIAGNOSIS — G4700 Insomnia, unspecified: Secondary | ICD-10-CM

## 2020-01-30 DIAGNOSIS — M79661 Pain in right lower leg: Secondary | ICD-10-CM | POA: Diagnosis not present

## 2020-01-30 MED ORDER — QUETIAPINE FUMARATE 50 MG PO TABS: 50.0000 mg | ORAL_TABLET | Freq: Every day | ORAL | 0 refills | Status: DC

## 2020-01-30 NOTE — ED Notes (Signed)
Belongings returned to pt from locker #25

## 2020-01-30 NOTE — BH Assessment (Signed)
Comprehensive Clinical Assessment (CCA) Note  01/30/2020 Robert Huff [id number]      Visit Diagnosis: F33.2 MDD, recurrent, severe without sx of psychosis; Insomnia Disposition: Robert Pickles, NP recommends pt follow up with current outpt tx providers  Robert Huff is a 59 yo married male who presents voluntarily to [facility]. Pt states he was referred for assessment by his sports medicine MD for making a suicidal statement. Pt states his primary problem is lack of sleep & that anyone not sleeping would not feel like living. Pt  Injured his knee 02/2019. The injury caused pt to not be able to do crossfit and other exercise that was very important in his life. Pt states he feels like he was robbed- he took care of his body and now this happened. Pt reports medication compliance- taking Prozac, but reports it does not seem to be helping him very much. Pt denies current suicidal ideation with plan. He denies past suicide attempts. Pt acknowledges multiple symptoms of Depression, including anhedonia, isolating, feelings of worthlessness & guilt, changes in sleep, & increased irritability. Pt denies homicidal ideation/ history of violence. Pt denies auditory & visual hallucinations & other symptoms of psychosis. Pt states current stressors include not sleeping, & not being able to exercise as much as he would like.   Pt lives with his wife. He denies hx of abuse and trauma. Pt denies family history of mental illness. He reports his great grandfather killed himself during the Depression at a very old age. Pt also reports his father was an alcoholic and died at age 60. 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 He reports his employer cut his hours and has him working at home due to his negative attitude. Pt has partial insight and judgment. Pt's memory is intact. Legal history includes no charges.  Protective factors against suicide include good family support, no access to firearms, no current psychotic  symptoms and no prior attempts.?  Pt's OP history includes a therapist that he has on hold right now. Pt was seeing her remotely but does not have access to laptop/computer to continue. Pt states it was time for a break and that it wasn't helping him much. We discussed [facility] program. Pt is not interested at this time- concerned about the cost and states he cannot do virtual visits due to lack of equipment. IP history includes a very brief stay in the ED awaiting inpt psychiatric tx. Pt tested positive for Covid and was to follow up with his ongoing outpt tx. Pt denies alcohol/ substance abuse. ? MSE: Pt is casually dressed, alert, oriented x5 with normal speech and normal motor behavior. Eye contact is good. Pt's mood is depressed and affect is irritable and depressed. Affect is congruent with mood. Thought process is coherent and relevant. There is no indication pt is currently responding to internal stimuli or experiencing delusional thought content. Pt was cooperative throughout assessment.   Disposition: Robert Pickles, NP recommends pt follow up with current outpt tx providers      ICD-10-CM   1. MDD (major depressive disorder), single episode, severe (Robert Huff)  F32.2   2. Insomnia, unspecified type  G47.00       CCA Screening, Triage and Referral (STR)  Patient Reported Information How did you hear about [place]? Primary Care  Referral name: Robert Hams, MD  Whom do you see for routine medical problems? Primary Care   What Is the Reason for Your Visit/Call Today? sent by Sports Medicine Physician  for SI  How Long Has This Been Causing You Problems? > than 6 months  What Do You Feel Would Help You the Most Today? Medication   Have You Recently Been in Any Inpatient Treatment (Hospital/Detox/Crisis Center/28-Day Program)? Yes  Name/Location of Program/Hospital:[facility]- [facility]  How Long Were You There? 2 days  When Were You Discharged? 07/31/19   Have You Ever Received Services  From [employer] Before? Yes  Who Do You See at [facility]? Primary & sports medicine   Have You Recently Had Any Thoughts About Hurting Yourself? Yes  Are You Planning to Commit Suicide/Harm Yourself At This time? No   Have you Recently Had Thoughts About [place]? No   Have You Used Any Alcohol or Drugs in the Past 24 Hours? No   Do You Currently Have a Therapist/Psychiatrist? Yes  Name of Therapist/Psychiatrist: was seeing virtually through work computer. Wanting to take a break bc not helping.   Have You Been Recently Discharged From Any Office Practice or Programs? No     CCA Screening Triage Referral Assessment Type of Contact: Face-to-Face  Patient Reported Information Reviewed? No  Patient Left Without Being Seen? No  Is CPS involved or ever been involved? Never  Is APS involved or ever been involved? Never   Patient Determined To Be At Risk for Harm To Self or Others Based on Review of Patient Reported Information or Presenting Complaint? No   Location of Assessment: [facility]   Does Patient Present under Involuntary Commitment? No   [place] of Residence: [place]   Patient Currently Receiving the Following Services: Individual Therapy   Determination of Need: Routine (7 days)   Options For Referral: Medication Management;Intensive Outpatient Therapy;Outpatient Therapy     CCA Biopsychosocial  Intake/Chief Complaint:  CCA Intake With Chief Complaint CCA Part Two Date: 01/30/20 CCA Part Two Time: 1651 Chief Complaint/Presenting Problem: Depression, insomnia, SI Patient's Currently Reported Symptoms/Problems: SI, Depression, insomnia  Mental Health Symptoms Depression:  Depression: Change in energy/activity, Difficulty Concentrating, Fatigue, Increase/decrease in appetite, Irritability, Sleep (too much or little), Worthlessness, Duration of symptoms greater than two weeks  Mania:  Mania: None  Anxiety:    Anxiety: Sleep, Tension, Irritability  Psychosis:  Psychosis: None  Trauma:  Trauma: None  Obsessions:  Obsessions: None  Compulsions:  Compulsions: Repeated behaviors/mental acts  Inattention:  Inattention: None  Hyperactivity/Impulsivity:     Oppositional/Defiant Behaviors:  Oppositional/Defiant Behaviors: None  Emotional Irregularity:  Emotional Irregularity: Intense/inappropriate anger  Other Mood/Personality Symptoms:      Mental Status Exam Appearance and self-care  Stature:  Stature: Average  Weight:  Weight: Average weight  Clothing:  Clothing: Casual  Grooming:  Grooming: Well-groomed  Cosmetic use:  Cosmetic Use: None  Posture/gait:  Posture/Gait: Tense, Normal  Motor activity:  Motor Activity: Not Remarkable  Sensorium  Attention:  Attention: Normal  Concentration:  Concentration: Normal  Orientation:  Orientation: X5  Recall/memory:  Recall/Memory: Normal  Affect and Mood  Affect:  Affect: Constricted, Negative  Mood:  Mood: Irritable, Pessimistic, Dysphoric, Negative  Relating  Eye contact:  Eye Contact: Normal  Facial expression:  Facial Expression: Constricted, Tense  Attitude toward examiner:  Attitude Toward Examiner: Cooperative, Irritable  Thought and Language  Speech flow: Speech Flow: Normal, Clear and Coherent  Thought content:  Thought Content: Appropriate to Mood and Circumstances  Preoccupation:  Preoccupations: None  Hallucinations:  Hallucinations: None  Organization:     Transport planner of Knowledge:  [employer]  of Knowledge: Good  Intelligence:  Intelligence: Above Average  Abstraction:  Abstraction: Normal  Judgement:  Judgement: Fair  Art therapist:  Reality Testing: Realistic  Insight:  Insight: Good  Decision Making:  Decision Making: Normal  Social Functioning  Social Maturity:  Social Maturity: Isolates  Social Judgement:  Social Judgement: Normal  Stress  Stressors:  Stressors: Museum/gallery curator, Work, Illness, Grief/losses  Coping  Ability:  Coping Ability: Research officer, political party Deficits:  Skill Deficits: None  Supports:  Supports: Family     Exercise/Diet: Exercise/Diet Do You Exercise?: Yes What Type of Exercise Do You Do?: Other (Comment) (cross-fit; less than a year ago due to injured knee) Do You Follow a Special Diet?: No Do You Have Any Trouble Sleeping?: Yes     CCA Substance Use  Alcohol/Drug Use: Alcohol / Drug Use Pain Medications: See MARs Prescriptions: See MARs Over the Counter: See MARs History of alcohol / drug use?: No history of alcohol / drug abuse                             DSM5 Diagnoses: Patient Active Problem List   Diagnosis Date Noted   Depression, major, single episode, severe ([place]) 07/31/2019   MDD (major depressive disorder), single episode, severe ([place]) 07/31/2019   Hemorrhagic prepatellar bursitis of left knee 07/05/2018   Family history of BRCA1 gene positive 05/31/2016   Right knee pain 06/11/2015   LOW BACK PAIN, CHRONIC 03/03/2008   HALLUX RIGIDUS 03/03/2008    [place]

## 2020-01-30 NOTE — ED Notes (Signed)
Pt discharged in no acute distress. Verbalized understanding of all discharge instructions. Denied SI/HI/AVH upon discharge. Safety maintained. Pt escorted to lobby via staff assist.

## 2020-01-30 NOTE — Patient Instructions (Addendum)
I am very worried about your mental health.  Please go to  behavioral health urgent care.  [address] Come into lobby.  I want you to call my office when you get there.   Find Help 24/7 Call our 24-hour HelpLine at 9181533519 or 210-629-9575 for immediate assistance for mental health and substance abuse issues. Or walk in to [facility] or the emergency department at [facility] for a prompt in-person crisis assessment.  Additional Resources [employer]: 1-800-SUICIDE The National Suicide Prevention Lifeline: 1-800-273-TALK  Schedule with me a 30 min visit Thursday.

## 2020-01-30 NOTE — Progress Notes (Signed)
° ° °  Subjective:    CC: R knee / shin pain  I, Molly Weber, LAT, ATC, am serving as scribe for Dr. Clementeen Graham.  HPI: Pt is a 59 y/o male presenting w/ c/o R knee and R lower leg pain.  He has been seen previously by Dr. Pearletha Forge for the same c/o and most recently had a R knee injection on 12/14/19.  He locates his pain to .  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   Additionally he has a history of depression rising around the same time of his knee injury.  He notes that he has been unable to exercise normally because of his knee.  He mentioned that he feels extremely poorly with his knee and inability to exercise.  He feels hopeless and lost and suicidal.  He does not have an active plan but is considering suicide.  He was admitted briefly for mental health/depression about 6 months ago and is currently taking Prozac and getting mental health counseling.  He also notes that he is about to lose his job because he cannot focus or sleep well because of his symptoms.    Diagnostic imaging: R tib/fib MRI- 01/30/20; R knee MRI- 03/10/19  Pertinent review of Systems: No fevers or chills, suicidal.  Relevant historical information: Major depression   Objective:    General: Well Developed, well nourished, appears distraught  MSK: Right knee normal-appearing no swelling .  Nontender to palpation distal anterior knee. Normal motion.  Stable ligamentous exam. Psych: Alert and oriented.  Flat affect tearful at times.  Patient has suicidal ideation but no homicidal ideation.  His thought process is tangential and perseverating.  He denies any active plans to commit suicide or hurt himself.  Lab and Radiology Results  MRI images of right tibia obtained today personally independent reviewed. Degenerative changes interarticular knee with no clear bony change or tendon disruption per my interpretation. Radiology overread  is not back yet.   Impression and Recommendations:    Assessment and Plan: 59 y.o. male with suicidal ideation.  This is the most dominant profound finding today.  Patient has clear major depression and now has some suicidal ideation.  When asked directly he does not have active intent but I am fearful of his near future.  I instructed him to go directly to [facility] behavioral health urgent care provided him with the address and directions on Google maps.  Additionally I provided him with suicidal resources including hotline.  Spent a great deal time today talking about how he feels hopeless and loss because of his knee pain.  Recheck back with me to focus on knee on Thursday.  Follow-up with PCP soon to focus on his mental health as well.  Knee pain: At this time somewhat unclear etiology.  I think he probably just has simple degenerative changes and is failing conservative management.  Await MRI tib-fib results from radiology.  We will focus more on this on Thursday..   Discussed warning signs or symptoms. Please see discharge instructions. Patient expresses understanding.   The above documentation has been reviewed and is accurate and complete Clementeen Graham, M.D.  Total encounter time 50 minutes including charting time date of service.

## 2020-01-30 NOTE — ED Provider Notes (Signed)
Behavioral Health Medical Screening Exam  Robert Huff is a 59 y.o. male.  Patient presents today reporting that he has been having a lot of difficulty with sleep.  He states that last year he had a knee injury and and this caused him a lot of issues.  He states the biggest thing is that he has not been able to exercise as much as he is wanting to.  He states that he is addicted to exercise and feels that he is taking care of his body very well and now that he has had this injury he just feels like this is the end of his road.  Patient stated that he made a comment about being suicidal because he wanted help for his knee pain and not sleeping.  The patient then stated that he is not going to hurt himself they just want some help with sleep.  He reports that normally he sleeps from about 10 PM to about midnight and then wakes up and his sleep is on and off until approximately 3 in the morning.  He states from 3 AM to about 6 AM he has up and down from the bed or to the couch trying to get more sleep.  He states that he has not really gotten any help with medications to help him with sleep and states that he has taken melatonin in the past but did not notice any significant improvement with it except for feeling very sleepy the next day.  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  Patient states that he lives at home with his wife and he has continued to take his Prozac daily.  Patient is irritable and he contributes that to very poor sleep for almost a year.  The patient again tells [place] that he will not harm himself and that he just wants some help with his sleep.  Patient denies any suicidal or homicidal ideations and denies any hallucinations.  Discussed with the patient of starting the medication to assist with sleep and also assist with his depression.  After discussing multiple medications  such as trazodone, Remeron, and Seroquel the patient agreed to start Seroquel 50 mg p.o. nightly.  This medication can assist the patient with his depression, irritability, and sleep and the patient was instructed to continue his Prozac 20 mg p.o. daily.  Total Time spent with patient: 30 minutes  Psychiatric Specialty Exam  Presentation  General Appearance:Well Groomed;Casual  Eye Contact:Good  Speech:Clear and Coherent;Normal Rate  Speech Volume:Normal  Handedness:Right   Mood and Affect  Mood:Irritable;Depressed  Affect:Congruent   Thought Process  Thought Processes:Coherent  Descriptions of Associations:Intact  Orientation:Full (Time, Place and Person)  Thought Content:WDL  Hallucinations:None  Ideas of Reference:None  Suicidal Thoughts:No  Homicidal Thoughts:No   Sensorium  Memory:Immediate Good;Recent Good;Remote Good  Judgment:Good  Insight:Good   Executive Functions  Concentration:Good  Attention Span:Good  Recall:Good  Fund of Knowledge:Good  Language:Good   Psychomotor Activity  Psychomotor Activity:Increased   Assets  Assets:Communication Skills;Desire for Improvement;Financial Resources/Insurance;Housing;Leisure Time;Physical Health;Social Support;Transportation   Sleep  Sleep:Poor  Number of hours: No data recorded  Physical Exam: Physical Exam Vitals and nursing note reviewed.  Constitutional:      Appearance: He is well-developed.  Cardiovascular:     Rate and Rhythm: Normal rate.  Pulmonary:     Effort: Pulmonary effort is  normal.  Musculoskeletal:        General: Normal range of motion.  Skin:    General: Skin is warm.  Neurological:     Mental Status: He is alert and oriented to person, place, and time.    Review of Systems  Constitutional: Negative.   HENT: Negative.   Eyes: Negative.   Respiratory: Negative.   Cardiovascular: Negative.   Gastrointestinal: Negative.   Genitourinary: Negative.    Musculoskeletal: Negative.   Skin: Negative.   Neurological: Negative.   Endo/Heme/Allergies: Negative.   Psychiatric/Behavioral: Positive for depression. The patient has insomnia.    Blood pressure 119/76, pulse 73, temperature 97.7 F (36.5 C), temperature source Tympanic, resp. rate 18, height 5\' 10"  (1.778 m), weight 181 lb (82.1 kg), SpO2 100 %. Body mass index is 25.97 kg/m.  Musculoskeletal: Strength & Muscle Tone: within normal limits Gait & Station: normal Patient leans: N/A   Recommendations:  Based on my evaluation the patient does not appear to have an emergency medical condition.  Kaja Jackowski, FNP 01/30/2020, 3:26 PM

## 2020-01-30 NOTE — ED Notes (Signed)
Patient items are in locker # 25.

## 2020-01-30 NOTE — Discharge Instructions (Addendum)
Continue current home medications Start Seroquel 50 mg by mouth at bedtime Return if symptoms worsen

## 2020-02-02 ENCOUNTER — Ambulatory Visit: Payer: Self-pay

## 2020-02-02 ENCOUNTER — Ambulatory Visit (INDEPENDENT_AMBULATORY_CARE_PROVIDER_SITE_OTHER): Payer: BC Managed Care – PPO | Admitting: Family Medicine

## 2020-02-02 ENCOUNTER — Other Ambulatory Visit: Payer: Self-pay

## 2020-02-02 DIAGNOSIS — G8929 Other chronic pain: Secondary | ICD-10-CM

## 2020-02-02 DIAGNOSIS — M25561 Pain in right knee: Secondary | ICD-10-CM

## 2020-02-02 NOTE — Progress Notes (Signed)
I, Robert Huff, LAT, ATC, am serving as scribe for Robert Huff.  Robert Huff is a 59 y.o. male who presents to [facility] today for f/u of R knee and lower leg pain.  He was last seen by Robert Huff on 01/30/20 and had previously been seen by Robert Huff for the same condition.  He most recently had a corticosteroid injection in his R knee on 12/14/19.  He notes he can has significant pain and is very bothered by his inability to exercise.  The last visit he was very emotionally upset and was contemplating suicide.  Was referred to behavioral health urgent care and was prescribed Seroquel at bedtime to help him sleep and he notes this is helped a lot  Diagnostic testing: R tib/fib MRI- 01/30/20; R knee MRI- 03/10/19   Pertinent review of systems: No fevers or chills  Relevant historical information: History prepatellar bursitis   Exam:   General: Well Developed, well nourished, and in no acute distress.   MSK: Right knee normal-appearing mildly tender palpation distal patellar tendon.    Lab and Radiology Results No results found for this or any previous visit (from the past 72 hour(s)). MR TIBIA FIBULA RIGHT WO CONTRAST  Result Date: 01/31/2020 CLINICAL DATA:  Lower leg pain. Clinical suspicion for stress fracture. EXAM: MRI OF LOWER RIGHT EXTREMITY WITHOUT CONTRAST TECHNIQUE: Multiplanar, multisequence MR imaging of the right tibia and fibula was performed. No intravenous contrast was administered. COMPARISON:  Right knee x-ray 02/01/2019 FINDINGS: Bones/Joint/Cartilage No acute fracture. Normal osseous alignment. No cortical thickening. No abnormal intra cortical signal changes. No bone marrow edema. No periosteal edema. No suspicious bone lesion. 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 Evaluation for internal  derangement is limited on large field-of-view imaging. Ligaments Grossly intact. Muscles and Tendons Normal muscle bulk and signal intensity without edema, atrophy, or fatty infiltration. Soft tissues No soft tissue edema or fluid collection. No evidence of soft tissue mass. IMPRESSION: 1. No acute abnormality of the right tibia or fibula. Specifically, no evidence of stress related changes within the tibia. 2. 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 Electronically Signed   By: Robert Huff D.O.   On: 01/31/2020 11:43  I, Robert Huff, personally (independently) visualized and performed the interpretation of the images attached in this note.   Procedure: Real-time Ultrasound Guided Injection of right knee pocket of fluid deep to distal patellar tendon superficial to Hoffa's fat pad Device: Philips Affiniti 50G Images permanently stored and available for review in the ultrasound unit. Verbal informed consent obtained.  Discussed risks and benefits of procedure. Warned about infection bleeding damage to structures skin hypopigmentation and fat atrophy among others. Patient expresses understanding and agreement Time-out conducted.   Noted no overlying erythema, induration, or other signs of local infection.   Skin prepped in a sterile fashion.   Local anesthesia: Topical Ethyl chloride.   With sterile technique and under real time ultrasound guidance:  PRP 42ml leukocyte depleted injected easily.   Completed without difficulty    Advised to call if fevers/chills, erythema, induration, drainage, or persistent bleeding.   Images permanently stored and available for review in the ultrasound unit.  Impression: Technically successful ultrasound guided injection.        Assessment and Plan: 59 y.o. male with right knee pain.  I suspect Hoffa's fat pad irritation as  main source of pain.  Patient has a small pocket of hypoechoic fluid in the area that he has pain.  MRI obtained  recently of tib-fib did not show significant patellar tendinitis but I think lacks the resolution is see the issue visible today on ultrasound.  He is already had trials of steroid injections with little benefit.  I do think that PRP is reasonable.  Plan to advance activity as tolerated with home exercise program taught in clinic today.  Recheck back as needed.  As for his insomnia contributing to his depression he was prescribed Seroquel at 50 mg.  This could be increased to 100 mg if still struggling.  Recommend follow-up with PCP.  I will prescribe backup Seroquel as an emergency if he cannot get to his PCP soon enough.   Orders Placed This Encounter  Procedures  . [place] LIMITED JOINT SPACE STRUCTURES LOW RIGHT(NO LINKED CHARGES)    Order Specific Question:   Reason for Exam (SYMPTOM  OR DIAGNOSIS REQUIRED)    Answer:   eval knee pain    Order Specific Question:   Preferred imaging location?    Answer:   [facility]   No orders of the defined types were placed in this encounter.    Discussed warning signs or symptoms. Please see discharge instructions. Patient expresses understanding.   The above documentation has been reviewed and is accurate and complete Robert Huff, M.D.    Of note this visit was previously arranged and should be included as part of PRP service charge.  No additional charge provided today.

## 2020-02-02 NOTE — Patient Instructions (Signed)
Thank you for coming in today. Call or go to the ER if you develop a large red swollen joint with extreme pain or oozing puss.  Avoid ibuprofen Aleve or aspirin for about 2 weeks. Take it easy for few weeks and gradually increase your activity as tolerated.  See included sheet. Recheck back with me as needed.  Discuss your sleep with your primary care provider.  Okay to increase Seroquel to 100mg  at bedtime.  If you are about to run out I will provide emergency refill until you can follow-up with PCP.

## 2020-02-17 ENCOUNTER — Other Ambulatory Visit: Payer: Self-pay | Admitting: Family Medicine

## 2020-02-17 DIAGNOSIS — F322 Major depressive disorder, single episode, severe without psychotic features: Secondary | ICD-10-CM

## 2020-03-06 ENCOUNTER — Ambulatory Visit (INDEPENDENT_AMBULATORY_CARE_PROVIDER_SITE_OTHER): Payer: BC Managed Care – PPO | Admitting: Family Medicine

## 2020-03-06 ENCOUNTER — Other Ambulatory Visit: Payer: Self-pay

## 2020-03-06 ENCOUNTER — Encounter: Payer: Self-pay | Admitting: Family Medicine

## 2020-03-06 VITALS — BP 120/68 | HR 64 | Temp 98.2°F | Wt 183.6 lb

## 2020-03-06 DIAGNOSIS — G4700 Insomnia, unspecified: Secondary | ICD-10-CM | POA: Diagnosis not present

## 2020-03-06 DIAGNOSIS — R5383 Other fatigue: Secondary | ICD-10-CM

## 2020-03-06 DIAGNOSIS — F322 Major depressive disorder, single episode, severe without psychotic features: Secondary | ICD-10-CM

## 2020-03-06 LAB — TESTOSTERONE: Testosterone: 539 ng/dL (ref 250–827)

## 2020-03-06 MED ORDER — TRAZODONE HCL 50 MG PO TABS: 25.0000 mg | ORAL_TABLET | Freq: Every evening | ORAL | 3 refills | Status: AC | PRN

## 2020-03-06 NOTE — Patient Instructions (Signed)

## 2020-03-06 NOTE — Progress Notes (Signed)
Established Patient Office Visit  Subjective:  Patient ID: Robert Huff, male    DOB: [birth year]  Age: 59 y.o. MRN: [id number]  CC:  Chief Complaint  Patient presents with  . Follow-up    pt is here for 2 month follow up on low energy level     HPI Robert Huff presents for fatigue and insomnia issues.  Refer to prior note for detail.  He has been unable to exercise at his previous level for this past year (secondary to knee pain) and this has been very emotionally distressing for him.  He has had though tremendous difficulty falling asleep and staying asleep.  He has been taking melatonin without improvement.  Was prescribed quetiapine by another provider but never stayed on this for a long.  He was upset about the fact that this is prescribed for bipolar and schizophrenia and he was not told that upfront.  He had apparently mentioned some suicidal ideation to sports medicine but denies any intent at this time.   We have been concerned about depression issues and he is on fluoxetine 20 mg daily.  He does not use any alcohol.  No caffeine use.  Regarding fatigue.  He has especially increased fatigue early in the mornings after he gets up.  He frequently walks his dog but then feels like laying back down to sleep.  He has had multiple labs including thyroid functions recently which were normal.  He does have concerns about possible low testosterone which has not been screened.  Past Medical History:  Diagnosis Date  . Arthritis   . Depression     No past surgical history on file.  Family History  Problem Relation Age of Onset  . Cancer Mother        Breast cancer  . Cancer Father        Lung cancer  . Early death Father   . Gout Brother   . Heart disease Maternal Grandmother   . Early death Maternal Grandfather   . Gout Brother   . Gout Brother   . Gout Brother     Social History   Socioeconomic History  . Marital status: Single    Spouse name: Not on file  .  Number of children: Not on file  . Years of education: Not on file  . Highest education level: Not on file  Occupational History  . Not on file  Tobacco Use  . Smoking status: Never Smoker  . Smokeless tobacco: Never Used  Substance and Sexual Activity  . Alcohol use: Yes    Alcohol/week: 1.0 - 2.0 standard drink    Types: 1 - 2 Standard drinks or equivalent per week  . Drug use: No  . Sexual activity: Yes  Other Topics Concern  . Not on file  Social History Narrative  . Not on file   Social Determinants of Health   Financial Resource Strain:   . Difficulty of Paying Living Expenses: Not on file  Food Insecurity:   . Worried About Programme researcher, broadcasting/film/video in the Last Year: Not on file  . Ran Out of Food in the Last Year: Not on file  Transportation Needs:   . Lack of Transportation (Medical): Not on file  . Lack of Transportation (Non-Medical): Not on file  Physical Activity:   . Days of Exercise per Week: Not on file  . Minutes of Exercise per Session: Not on file  Stress:   . Feeling of Stress :  Not on file  Social Connections:   . Frequency of Communication with Friends and Family: Not on file  . Frequency of Social Gatherings with Friends and Family: Not on file  . Attends Religious Services: Not on file  . Active Member of Clubs or Organizations: Not on file  . Attends Banker Meetings: Not on file  . Marital Status: Not on file  Intimate Partner Violence:   . Fear of Current or Ex-Partner: Not on file  . Emotionally Abused: Not on file  . Physically Abused: Not on file  . Sexually Abused: Not on file    Outpatient Medications Prior to Visit  Medication Sig Dispense Refill  . Boswellia-Glucosamine-Vit D (GLUCOSAMINE COMPLEX PO) Take by mouth.    Marland Kitchen FLUoxetine (PROZAC) 20 MG tablet TAKE 1 TABLET BY MOUTH EVERY DAY 90 tablet 2  . Melatonin 1 MG CAPS Take by mouth.    . Multiple Vitamin (MULTIVITAMIN WITH MINERALS) TABS tablet Take 1 tablet by mouth  daily.    Marland Kitchen VITAMIN D PO Take 1 capsule by mouth daily.    Marland Kitchen DICLOFENAC PO Take 1 tablet by mouth as needed (pain).    Marland Kitchen ibuprofen (ADVIL) 200 MG tablet Take 200 mg by mouth every 6 (six) hours as needed for fever, headache or moderate pain.    . nitroGLYCERIN (NITRODUR - DOSED IN MG/24 HR) 0.2 mg/hr patch Apply 1/4th patch to affected knee, change daily (Patient not taking: Reported on 05/25/2019) 30 patch 1  . QUEtiapine (SEROQUEL) 50 MG tablet Take 1 tablet (50 mg total) by mouth at bedtime. 30 tablet 0  . Vitamin D, Ergocalciferol, (DRISDOL) 1.25 MG (50000 UT) CAPS capsule Take 1 capsule (50,000 Units total) by mouth every 7 (seven) days. (Patient not taking: Reported on 08/01/2019) 8 capsule 0   No facility-administered medications prior to visit.    No Known Allergies  ROS Review of Systems  Constitutional: Positive for fatigue. Negative for appetite change, chills, fever and unexpected weight change.  Respiratory: Negative for shortness of breath.   Cardiovascular: Negative for chest pain.  Psychiatric/Behavioral: Positive for sleep disturbance. Negative for agitation and suicidal ideas.      Objective:    Physical Exam Vitals reviewed.  Constitutional:      Appearance: Normal appearance.  Cardiovascular:     Rate and Rhythm: Normal rate and regular rhythm.  Pulmonary:     Effort: Pulmonary effort is normal.     Breath sounds: Normal breath sounds.  Neurological:     Mental Status: He is alert.  Psychiatric:        Mood and Affect: Mood normal.        Thought Content: Thought content normal.     BP 120/68 (BP Location: Left Arm, Patient Position: Sitting, Cuff Size: Normal)   Pulse 64   Temp 98.2 F (36.8 C) (Oral)   Wt 183 lb 9.6 oz (83.3 kg)   SpO2 97%   BMI 26.34 kg/m  Wt Readings from Last 3 Encounters:  03/06/20 183 lb 9.6 oz (83.3 kg)  12/16/19 182 lb 11.2 oz (82.9 kg)  12/14/19 182 lb (82.6 kg)     Health Maintenance Due  Topic Date Due  .  Hepatitis C Screening  Never done  . COVID-19 Vaccine (1) Never done  . HIV Screening  Never done  . COLONOSCOPY  Never done  . INFLUENZA VACCINE  02/12/2020    There are no preventive care reminders to display for this patient.  Lab Results  Component Value Date   TSH 1.757 08/01/2019   Lab Results  Component Value Date   WBC 6.5 08/01/2019   HGB 15.0 08/01/2019   HCT 45.5 08/01/2019   MCV 91.9 08/01/2019   PLT 284 08/01/2019   Lab Results  Component Value Date   NA 137 08/01/2019   K 3.7 08/01/2019   CO2 24 08/01/2019   GLUCOSE 112 (H) 08/01/2019   BUN 14 08/01/2019   CREATININE 0.89 08/01/2019   BILITOT 0.8 08/01/2019   ALKPHOS 56 08/01/2019   AST 22 08/01/2019   ALT 19 08/01/2019   PROT 6.6 08/01/2019   ALBUMIN 4.1 08/01/2019   CALCIUM 9.0 08/01/2019   ANIONGAP 10 08/01/2019   Lab Results  Component Value Date   CHOL 186 08/01/2019   Lab Results  Component Value Date   HDL 56 08/01/2019   Lab Results  Component Value Date   LDLCALC 125 (H) 08/01/2019   Lab Results  Component Value Date   TRIG 24 08/01/2019   Lab Results  Component Value Date   CHOLHDL 3.3 08/01/2019   Lab Results  Component Value Date   HGBA1C 5.6 08/01/2019      Assessment & Plan:   #1 insomnia.  He has difficulties both falling asleep and staying asleep.  Currently on melatonin without much benefit.  No exacerbating factors such as alcohol or caffeine  -Discussed sleep hygiene with handout given -Discussed possible trial of trazodone 50 mg nightly to use as needed  #2 fatigue possibly related to #1  -Check early morning testosterone screen  #3 depression.  Patient currently on Prozac 20 mg daily.  He denies any current suicidal ideation though he apparently had expressed this a month ago to sports medicine.  Follow-up immediately for any worsening depression or suicidal intent  Meds ordered this encounter  Medications  . traZODone (DESYREL) 50 MG tablet    Sig:  Take 0.5-1 tablets (25-50 mg total) by mouth at bedtime as needed for sleep.    Dispense:  30 tablet    Refill:  3    Follow-up: No follow-ups on file.    Evelena Peat, MD

## 2020-03-26 ENCOUNTER — Encounter: Payer: Self-pay | Admitting: Family Medicine

## 2020-03-27 DIAGNOSIS — Z20822 Contact with and (suspected) exposure to covid-19: Secondary | ICD-10-CM | POA: Diagnosis not present

## 2020-04-07 ENCOUNTER — Encounter: Payer: Self-pay | Admitting: Family Medicine

## 2020-04-10 ENCOUNTER — Encounter: Payer: Self-pay | Admitting: Family Medicine

## 2020-04-10 ENCOUNTER — Other Ambulatory Visit: Payer: Self-pay

## 2020-04-10 MED ORDER — FLUOXETINE HCL 10 MG PO CAPS: 10.0000 mg | ORAL_CAPSULE | Freq: Every day | ORAL | 1 refills | Status: AC

## 2020-04-10 NOTE — Telephone Encounter (Signed)
We could reduce dose of Prozac to 10 mg once daily #90 with one refill.  Go ahead and send in Prozac 10 mg po qd and take the 20 mg off med list.  Let pt know we reduced dose.

## 2020-04-23 ENCOUNTER — Emergency Department (HOSPITAL_COMMUNITY): Payer: BC Managed Care – PPO

## 2020-04-23 ENCOUNTER — Emergency Department (HOSPITAL_COMMUNITY): Payer: BC Managed Care – PPO | Admitting: Certified Registered Nurse Anesthetist

## 2020-04-23 ENCOUNTER — Inpatient Hospital Stay (HOSPITAL_COMMUNITY): Payer: BC Managed Care – PPO

## 2020-04-23 ENCOUNTER — Encounter (HOSPITAL_COMMUNITY): Admission: EM | Disposition: E | Payer: Self-pay | Source: Home / Self Care

## 2020-04-23 ENCOUNTER — Inpatient Hospital Stay (HOSPITAL_COMMUNITY)
Admission: EM | Admit: 2020-04-23 | Discharge: 2020-05-14 | DRG: 957 | Disposition: E | Payer: BC Managed Care – PPO | Attending: Physician Assistant | Admitting: Physician Assistant

## 2020-04-23 ENCOUNTER — Encounter (HOSPITAL_COMMUNITY): Payer: Self-pay

## 2020-04-23 DIAGNOSIS — S3993XA Unspecified injury of pelvis, initial encounter: Secondary | ICD-10-CM | POA: Diagnosis not present

## 2020-04-23 DIAGNOSIS — R45851 Suicidal ideations: Secondary | ICD-10-CM | POA: Diagnosis present

## 2020-04-23 DIAGNOSIS — S2243XA Multiple fractures of ribs, bilateral, initial encounter for closed fracture: Secondary | ICD-10-CM | POA: Diagnosis present

## 2020-04-23 DIAGNOSIS — S31109A Unspecified open wound of abdominal wall, unspecified quadrant without penetration into peritoneal cavity, initial encounter: Secondary | ICD-10-CM | POA: Diagnosis not present

## 2020-04-23 DIAGNOSIS — S37062A Major laceration of left kidney, initial encounter: Secondary | ICD-10-CM | POA: Diagnosis not present

## 2020-04-23 DIAGNOSIS — S5292XB Unspecified fracture of left forearm, initial encounter for open fracture type I or II: Secondary | ICD-10-CM | POA: Diagnosis present

## 2020-04-23 DIAGNOSIS — S36039A Unspecified laceration of spleen, initial encounter: Principal | ICD-10-CM | POA: Diagnosis present

## 2020-04-23 DIAGNOSIS — R58 Hemorrhage, not elsewhere classified: Secondary | ICD-10-CM | POA: Diagnosis not present

## 2020-04-23 DIAGNOSIS — S82202B Unspecified fracture of shaft of left tibia, initial encounter for open fracture type I or II: Secondary | ICD-10-CM | POA: Diagnosis not present

## 2020-04-23 DIAGNOSIS — S27321A Contusion of lung, unilateral, initial encounter: Secondary | ICD-10-CM | POA: Diagnosis not present

## 2020-04-23 DIAGNOSIS — Z66 Do not resuscitate: Secondary | ICD-10-CM | POA: Diagnosis not present

## 2020-04-23 DIAGNOSIS — J939 Pneumothorax, unspecified: Secondary | ICD-10-CM | POA: Diagnosis not present

## 2020-04-23 DIAGNOSIS — S225XXA Flail chest, initial encounter for closed fracture: Secondary | ICD-10-CM

## 2020-04-23 DIAGNOSIS — R404 Transient alteration of awareness: Secondary | ICD-10-CM | POA: Diagnosis not present

## 2020-04-23 DIAGNOSIS — S27329A Contusion of lung, unspecified, initial encounter: Secondary | ICD-10-CM | POA: Diagnosis not present

## 2020-04-23 DIAGNOSIS — S52302C Unspecified fracture of shaft of left radius, initial encounter for open fracture type IIIA, IIIB, or IIIC: Secondary | ICD-10-CM | POA: Diagnosis not present

## 2020-04-23 DIAGNOSIS — S82201B Unspecified fracture of shaft of right tibia, initial encounter for open fracture type I or II: Secondary | ICD-10-CM | POA: Diagnosis not present

## 2020-04-23 DIAGNOSIS — F32A Depression, unspecified: Secondary | ICD-10-CM | POA: Diagnosis not present

## 2020-04-23 DIAGNOSIS — Z20822 Contact with and (suspected) exposure to covid-19: Secondary | ICD-10-CM | POA: Clinically undetermined

## 2020-04-23 DIAGNOSIS — S272XXA Traumatic hemopneumothorax, initial encounter: Secondary | ICD-10-CM | POA: Diagnosis present

## 2020-04-23 DIAGNOSIS — S3600XA Unspecified injury of spleen, initial encounter: Secondary | ICD-10-CM | POA: Diagnosis not present

## 2020-04-23 DIAGNOSIS — S82202C Unspecified fracture of shaft of left tibia, initial encounter for open fracture type IIIA, IIIB, or IIIC: Secondary | ICD-10-CM | POA: Diagnosis not present

## 2020-04-23 DIAGNOSIS — K661 Hemoperitoneum: Secondary | ICD-10-CM | POA: Diagnosis not present

## 2020-04-23 DIAGNOSIS — S36032A Major laceration of spleen, initial encounter: Secondary | ICD-10-CM | POA: Diagnosis not present

## 2020-04-23 DIAGNOSIS — S2242XA Multiple fractures of ribs, left side, initial encounter for closed fracture: Secondary | ICD-10-CM | POA: Diagnosis not present

## 2020-04-23 DIAGNOSIS — S0990XA Unspecified injury of head, initial encounter: Secondary | ICD-10-CM | POA: Diagnosis not present

## 2020-04-23 DIAGNOSIS — R4020 Unspecified coma: Secondary | ICD-10-CM | POA: Diagnosis not present

## 2020-04-23 DIAGNOSIS — R000 Tachycardia, unspecified: Secondary | ICD-10-CM | POA: Diagnosis not present

## 2020-04-23 DIAGNOSIS — S5292XC Unspecified fracture of left forearm, initial encounter for open fracture type IIIA, IIIB, or IIIC: Secondary | ICD-10-CM | POA: Diagnosis not present

## 2020-04-23 DIAGNOSIS — R578 Other shock: Secondary | ICD-10-CM | POA: Diagnosis not present

## 2020-04-23 DIAGNOSIS — S37009A Unspecified injury of unspecified kidney, initial encounter: Secondary | ICD-10-CM | POA: Diagnosis not present

## 2020-04-23 DIAGNOSIS — T794XXA Traumatic shock, initial encounter: Secondary | ICD-10-CM | POA: Diagnosis not present

## 2020-04-23 DIAGNOSIS — S82402B Unspecified fracture of shaft of left fibula, initial encounter for open fracture type I or II: Secondary | ICD-10-CM | POA: Diagnosis present

## 2020-04-23 DIAGNOSIS — S2231XA Fracture of one rib, right side, initial encounter for closed fracture: Secondary | ICD-10-CM | POA: Diagnosis not present

## 2020-04-23 DIAGNOSIS — S36899A Unspecified injury of other intra-abdominal organs, initial encounter: Secondary | ICD-10-CM

## 2020-04-23 DIAGNOSIS — S3681XA Injury of peritoneum, initial encounter: Secondary | ICD-10-CM | POA: Diagnosis not present

## 2020-04-23 HISTORY — PX: SPLENECTOMY, TOTAL: SHX788

## 2020-04-23 HISTORY — PX: NEPHRECTOMY: SHX65

## 2020-04-23 HISTORY — PX: LAPAROTOMY: SHX154

## 2020-04-23 HISTORY — PX: APPLICATION OF WOUND VAC: SHX5189

## 2020-04-23 LAB — POCT I-STAT 7, (LYTES, BLD GAS, ICA,H+H)
Acid-base deficit: 4 mmol/L — ABNORMAL HIGH (ref 0.0–2.0)
Acid-base deficit: 5 mmol/L — ABNORMAL HIGH (ref 0.0–2.0)
Acid-base deficit: 6 mmol/L — ABNORMAL HIGH (ref 0.0–2.0)
Acid-base deficit: 5 mmol/L — ABNORMAL HIGH (ref 0.0–2.0)
Bicarbonate: 25.2 mmol/L (ref 20.0–28.0)
Bicarbonate: 21.9 mmol/L (ref 20.0–28.0)
Bicarbonate: 22.2 mmol/L (ref 20.0–28.0)
Bicarbonate: 21.6 mmol/L (ref 20.0–28.0)
Calcium, Ion: 0.38 mmol/L — CL (ref 1.15–1.40)
Calcium, Ion: 0.35 mmol/L — CL (ref 1.15–1.40)
Calcium, Ion: 0.55 mmol/L — CL (ref 1.15–1.40)
Calcium, Ion: 1.12 mmol/L — ABNORMAL LOW (ref 1.15–1.40)
HCT: 26 % — ABNORMAL LOW (ref 39.0–52.0)
HCT: 27 % — ABNORMAL LOW (ref 39.0–52.0)
HCT: 29 % — ABNORMAL LOW (ref 39.0–52.0)
HCT: 22 % — ABNORMAL LOW (ref 39.0–52.0)
Hemoglobin: 8.8 g/dL — ABNORMAL LOW (ref 13.0–17.0)
Hemoglobin: 9.2 g/dL — ABNORMAL LOW (ref 13.0–17.0)
Hemoglobin: 9.9 g/dL — ABNORMAL LOW (ref 13.0–17.0)
Hemoglobin: 7.5 g/dL — ABNORMAL LOW (ref 13.0–17.0)
O2 Saturation: 95 %
O2 Saturation: 79 %
O2 Saturation: 86 %
O2 Saturation: 82 %
Patient temperature: 34
Potassium: 6.1 mmol/L — ABNORMAL HIGH (ref 3.5–5.1)
Potassium: 6.5 mmol/L (ref 3.5–5.1)
Potassium: 7.2 mmol/L (ref 3.5–5.1)
Potassium: 4.5 mmol/L (ref 3.5–5.1)
Sodium: 139 mmol/L (ref 135–145)
Sodium: 138 mmol/L (ref 135–145)
Sodium: 139 mmol/L (ref 135–145)
Sodium: 144 mmol/L (ref 135–145)
TCO2: 27 mmol/L (ref 22–32)
TCO2: 24 mmol/L (ref 22–32)
TCO2: 24 mmol/L (ref 22–32)
TCO2: 23 mmol/L (ref 22–32)
pCO2 arterial: 72.8 mmHg (ref 32.0–48.0)
pCO2 arterial: 51.1 mmHg — ABNORMAL HIGH (ref 32.0–48.0)
pCO2 arterial: 54.3 mmHg — ABNORMAL HIGH (ref 32.0–48.0)
pCO2 arterial: 43.9 mmHg (ref 32.0–48.0)
pH, Arterial: 7.146 — CL (ref 7.350–7.450)
pH, Arterial: 7.213 — ABNORMAL LOW (ref 7.350–7.450)
pH, Arterial: 7.246 — ABNORMAL LOW (ref 7.350–7.450)
pH, Arterial: 7.284 — ABNORMAL LOW (ref 7.350–7.450)
pO2, Arterial: 100 mmHg (ref 83.0–108.0)
pO2, Arterial: 51 mmHg — ABNORMAL LOW (ref 83.0–108.0)
pO2, Arterial: 63 mmHg — ABNORMAL LOW (ref 83.0–108.0)
pO2, Arterial: 44 mmHg — ABNORMAL LOW (ref 83.0–108.0)

## 2020-04-23 LAB — BASIC METABOLIC PANEL
Anion gap: 18 — ABNORMAL HIGH (ref 5–15)
BUN: 18 mg/dL (ref 8–23)
CO2: 22 mmol/L (ref 22–32)
Calcium: 10 mg/dL (ref 8.9–10.3)
Chloride: 107 mmol/L (ref 98–111)
Creatinine, Ser: 1.86 mg/dL — ABNORMAL HIGH (ref 0.61–1.24)
GFR, Estimated: 38 mL/min — ABNORMAL LOW (ref >60)
Glucose, Bld: 266 mg/dL — ABNORMAL HIGH (ref 70–99)
Potassium: 3.8 mmol/L (ref 3.5–5.1)
Sodium: 147 mmol/L — ABNORMAL HIGH (ref 135–145)

## 2020-04-23 LAB — CBC WITH DIFFERENTIAL/PLATELET
Abs Immature Granulocytes: 0.04 10*3/uL (ref 0.00–0.07)
Basophils Absolute: 0 10*3/uL (ref 0.0–0.1)
Basophils Relative: 0 %
Eosinophils Absolute: 0 10*3/uL (ref 0.0–0.5)
Eosinophils Relative: 0 %
HCT: 35 % — ABNORMAL LOW (ref 39.0–52.0)
Hemoglobin: 11.1 g/dL — ABNORMAL LOW (ref 13.0–17.0)
Immature Granulocytes: 1 %
Lymphocytes Relative: 9 %
Lymphs Abs: 0.4 10*3/uL — ABNORMAL LOW (ref 0.7–4.0)
MCH: 29.4 pg (ref 26.0–34.0)
MCHC: 31.7 g/dL (ref 30.0–36.0)
MCV: 92.6 fL (ref 80.0–100.0)
Monocytes Absolute: 0.3 10*3/uL (ref 0.1–1.0)
Monocytes Relative: 7 %
Neutro Abs: 4 10*3/uL (ref 1.7–7.7)
Neutrophils Relative %: 83 %
Platelets: 53 10*3/uL — ABNORMAL LOW (ref 150–400)
RBC: 3.78 MIL/uL — ABNORMAL LOW (ref 4.22–5.81)
RDW: 15 % (ref 11.5–15.5)
WBC: 4.7 10*3/uL (ref 4.0–10.5)
nRBC: 0 % (ref 0.0–0.2)

## 2020-04-23 LAB — ABO/RH: ABO/RH(D): O POS

## 2020-04-23 LAB — I-STAT CHEM 8, ED
BUN: 24 mg/dL — ABNORMAL HIGH (ref 8–23)
Calcium, Ion: 1.11 mmol/L — ABNORMAL LOW (ref 1.15–1.40)
Chloride: 101 mmol/L (ref 98–111)
Creatinine, Ser: 1.4 mg/dL — ABNORMAL HIGH (ref 0.61–1.24)
Glucose, Bld: 310 mg/dL — ABNORMAL HIGH (ref 70–99)
HCT: 34 % — ABNORMAL LOW (ref 39.0–52.0)
Hemoglobin: 11.6 g/dL — ABNORMAL LOW (ref 13.0–17.0)
Potassium: 3.9 mmol/L (ref 3.5–5.1)
Sodium: 136 mmol/L (ref 135–145)
TCO2: 22 mmol/L (ref 22–32)

## 2020-04-23 LAB — PREPARE RBC (CROSSMATCH)

## 2020-04-23 LAB — RESPIRATORY PANEL BY RT PCR (FLU A&B, COVID)
Influenza A by PCR: NEGATIVE
Influenza B by PCR: NEGATIVE
SARS Coronavirus 2 by RT PCR: NEGATIVE

## 2020-04-23 SURGERY — LAPAROTOMY, EXPLORATORY
Anesthesia: General | Site: Abdomen

## 2020-04-23 MED ORDER — CHLORHEXIDINE GLUCONATE 0.12% ORAL RINSE (MEDLINE KIT): 15.0000 mL | Freq: Two times a day (BID) | OROMUCOSAL | Status: DC

## 2020-04-23 MED ORDER — PHENYLEPHRINE HCL (PRESSORS) 10 MG/ML IV SOLN
INTRAVENOUS | Status: DC | PRN
  Administered 2020-04-23 (×2): 100 ug via INTRAVENOUS
  Administered 2020-04-23: 200 ug via INTRAVENOUS
  Administered 2020-04-23: 100 ug via INTRAVENOUS

## 2020-04-23 MED ORDER — CEFAZOLIN SODIUM 1 G IJ SOLR
INTRAMUSCULAR | Status: AC
  Filled 2020-04-23: qty 20

## 2020-04-23 MED ORDER — CEFAZOLIN SODIUM-DEXTROSE 2-3 GM-%(50ML) IV SOLR
INTRAVENOUS | Status: DC | PRN
  Administered 2020-04-23: 2 g via INTRAVENOUS

## 2020-04-23 MED ORDER — NOREPINEPHRINE 4 MG/250ML-% IV SOLN: 2.0000 ug/min | INTRAVENOUS | Status: DC

## 2020-04-23 MED ORDER — ROCURONIUM BROMIDE 10 MG/ML (PF) SYRINGE
PREFILLED_SYRINGE | INTRAVENOUS | Status: AC
  Filled 2020-04-23: qty 20

## 2020-04-23 MED ORDER — DOCUSATE SODIUM 50 MG/5ML PO LIQD: 100.0000 mg | Freq: Two times a day (BID) | ORAL | Status: DC

## 2020-04-23 MED ORDER — TRANEXAMIC ACID 1000 MG/10ML IV SOLN
1.5000 mg/kg/h | INTRAVENOUS | Status: AC
  Administered 2020-04-23: 1.5 mg/kg/h via INTRAVENOUS
  Filled 2020-04-23: qty 25

## 2020-04-23 MED ORDER — MIDAZOLAM HCL 5 MG/5ML IJ SOLN
INTRAMUSCULAR | Status: DC | PRN
  Administered 2020-04-23 (×2): 1 mg via INTRAVENOUS

## 2020-04-23 MED ORDER — ALBUMIN HUMAN 5 % IV SOLN: INTRAVENOUS | Status: DC | PRN

## 2020-04-23 MED ORDER — VASOPRESSIN 20 UNITS/100 ML INFUSION FOR SHOCK
INTRAVENOUS | Status: DC | PRN
  Administered 2020-04-23: .04 [IU]/min via INTRAVENOUS

## 2020-04-23 MED ORDER — VASOPRESSIN 20 UNIT/ML IV SOLN
INTRAVENOUS | Status: DC | PRN
  Administered 2020-04-23 (×2): 2 [IU] via INTRAVENOUS
  Administered 2020-04-23: 4 [IU] via INTRAVENOUS
  Administered 2020-04-23: 2 [IU] via INTRAVENOUS

## 2020-04-23 MED ORDER — FENTANYL CITRATE (PF) 250 MCG/5ML IJ SOLN
INTRAMUSCULAR | Status: AC
  Filled 2020-04-23: qty 5

## 2020-04-23 MED ORDER — VASOPRESSIN 20 UNITS/100 ML INFUSION FOR SHOCK
0.0000 [IU]/min | INTRAVENOUS | Status: DC
  Filled 2020-04-23: qty 100

## 2020-04-23 MED ORDER — ONDANSETRON HCL 4 MG/2ML IJ SOLN
4.0000 mg | Freq: Four times a day (QID) | INTRAMUSCULAR | Status: DC | PRN
  Filled 2020-04-23: qty 2

## 2020-04-23 MED ORDER — NOREPINEPHRINE 16 MG/250ML-% IV SOLN
2.0000 ug/min | INTRAVENOUS | Status: DC
  Filled 2020-04-23 (×3): qty 250

## 2020-04-23 MED ORDER — PHENYLEPHRINE HCL-NACL 10-0.9 MG/250ML-% IV SOLN
INTRAVENOUS | Status: AC
  Filled 2020-04-23: qty 250

## 2020-04-23 MED ORDER — PROPOFOL 1000 MG/100ML IV EMUL
INTRAVENOUS | Status: AC
  Filled 2020-04-23: qty 100

## 2020-04-23 MED ORDER — LIDOCAINE 2% (20 MG/ML) 5 ML SYRINGE
INTRAMUSCULAR | Status: AC
  Filled 2020-04-23: qty 5

## 2020-04-23 MED ORDER — SODIUM BICARBONATE 8.4 % IV SOLN
INTRAVENOUS | Status: AC
  Filled 2020-04-23: qty 50

## 2020-04-23 MED ORDER — FENTANYL CITRATE (PF) 100 MCG/2ML IJ SOLN: 50.0000 ug | INTRAMUSCULAR | Status: DC | PRN

## 2020-04-23 MED ORDER — 0.9 % SODIUM CHLORIDE (POUR BTL) OPTIME
TOPICAL | Status: DC | PRN
  Administered 2020-04-23 (×2): 1000 mL

## 2020-04-23 MED ORDER — LACTATED RINGERS IV SOLN: INTRAVENOUS | Status: DC | PRN

## 2020-04-23 MED ORDER — CALCIUM CHLORIDE 10 % IV SOLN
INTRAVENOUS | Status: DC | PRN
  Administered 2020-04-23 (×7): 1 g via INTRAVENOUS

## 2020-04-23 MED ORDER — MIDAZOLAM HCL 2 MG/2ML IJ SOLN
INTRAMUSCULAR | Status: AC
  Filled 2020-04-23: qty 2

## 2020-04-23 MED ORDER — TRANEXAMIC ACID 1000 MG/10ML IV SOLN: INTRAVENOUS | Status: DC | PRN

## 2020-04-23 MED ORDER — SODIUM CHLORIDE 0.9 % IV SOLN: INTRAVENOUS | Status: DC | PRN

## 2020-04-23 MED ORDER — SODIUM BICARBONATE 8.4 % IV SOLN
INTRAVENOUS | Status: DC | PRN
  Administered 2020-04-23: 50 meq via INTRAVENOUS

## 2020-04-23 MED ORDER — PROPOFOL 500 MG/50ML IV EMUL
INTRAVENOUS | Status: DC | PRN
  Administered 2020-04-23: 20 ug/kg/min via INTRAVENOUS

## 2020-04-23 MED ORDER — ETOMIDATE 2 MG/ML IV SOLN
INTRAVENOUS | Status: AC | PRN
  Administered 2020-04-23: 20 mg via INTRAVENOUS

## 2020-04-23 MED ORDER — CALCIUM CHLORIDE 10 % IV SOLN
INTRAVENOUS | Status: AC
  Filled 2020-04-23: qty 20

## 2020-04-23 MED ORDER — ROCURONIUM BROMIDE 100 MG/10ML IV SOLN
INTRAVENOUS | Status: DC | PRN
  Administered 2020-04-23: 50 mg via INTRAVENOUS
  Administered 2020-04-23: 60 mg via INTRAVENOUS
  Administered 2020-04-23: 50 mg via INTRAVENOUS

## 2020-04-23 MED ORDER — PHENYLEPHRINE HCL-NACL 10-0.9 MG/250ML-% IV SOLN
INTRAVENOUS | Status: DC | PRN
  Administered 2020-04-23: 75 ug/min via INTRAVENOUS

## 2020-04-23 MED ORDER — SODIUM CHLORIDE 0.9 % IV SOLN: INTRAVENOUS | Status: DC

## 2020-04-23 MED ORDER — NOREPINEPHRINE 4 MG/250ML-% IV SOLN
INTRAVENOUS | Status: DC | PRN
  Administered 2020-04-23: 4 ug/min via INTRAVENOUS

## 2020-04-23 MED ORDER — SODIUM CHLORIDE 0.9 % IV SOLN
2.0000 g | INTRAVENOUS | Status: DC
  Filled 2020-04-23: qty 20

## 2020-04-23 MED ORDER — NOREPINEPHRINE 16 MG/250ML-% IV SOLN: 0.0000 ug/min | INTRAVENOUS | Status: DC

## 2020-04-23 MED ORDER — SODIUM CHLORIDE 0.9% IV SOLUTION: Freq: Once | INTRAVENOUS | Status: DC

## 2020-04-23 MED ORDER — ONDANSETRON 4 MG PO TBDP
4.0000 mg | ORAL_TABLET | Freq: Four times a day (QID) | ORAL | Status: DC | PRN
  Filled 2020-04-23: qty 1

## 2020-04-23 MED ORDER — EPINEPHRINE 1 MG/10ML IJ SOSY
PREFILLED_SYRINGE | INTRAMUSCULAR | Status: AC
  Filled 2020-04-23: qty 10

## 2020-04-23 MED ORDER — VASOPRESSIN 20 UNIT/ML IV SOLN
INTRAVENOUS | Status: AC
  Filled 2020-04-23: qty 1

## 2020-04-23 MED ORDER — PHENYLEPHRINE 40 MCG/ML (10ML) SYRINGE FOR IV PUSH (FOR BLOOD PRESSURE SUPPORT)
PREFILLED_SYRINGE | INTRAVENOUS | Status: AC
  Filled 2020-04-23: qty 20

## 2020-04-23 MED ORDER — POLYETHYLENE GLYCOL 3350 17 G PO PACK: 17.0000 g | PACK | Freq: Every day | ORAL | Status: DC

## 2020-04-23 MED ORDER — SODIUM CHLORIDE 0.9 % IV SOLN: 250.0000 mL | INTRAVENOUS | Status: DC

## 2020-04-23 MED ORDER — METOPROLOL TARTRATE 5 MG/5ML IV SOLN
5.0000 mg | Freq: Four times a day (QID) | INTRAVENOUS | Status: DC | PRN
  Filled 2020-04-23: qty 5

## 2020-04-23 MED ORDER — SUCCINYLCHOLINE CHLORIDE 20 MG/ML IJ SOLN
INTRAMUSCULAR | Status: AC | PRN
  Administered 2020-04-23: 200 mg via INTRAVENOUS

## 2020-04-23 MED ORDER — PHENYLEPHRINE 40 MCG/ML (10ML) SYRINGE FOR IV PUSH (FOR BLOOD PRESSURE SUPPORT)
PREFILLED_SYRINGE | INTRAVENOUS | Status: AC
  Filled 2020-04-23: qty 10

## 2020-04-23 MED ORDER — SODIUM BICARBONATE 4.2 % IV SOLN
INTRAVENOUS | Status: DC | PRN
  Administered 2020-04-23 (×2): 50 meq via INTRAVENOUS

## 2020-04-23 MED ORDER — CALCIUM CHLORIDE 10 % IV SOLN
INTRAVENOUS | Status: AC
  Filled 2020-04-23: qty 10

## 2020-04-23 MED ORDER — PHENYLEPHRINE HCL-NACL 10-0.9 MG/250ML-% IV SOLN
0.0000 ug/min | INTRAVENOUS | Status: DC
  Filled 2020-04-23 (×4): qty 250

## 2020-04-23 MED ORDER — PROPOFOL 1000 MG/100ML IV EMUL: 0.0000 ug/kg/min | INTRAVENOUS | Status: DC

## 2020-04-23 MED ORDER — ORAL CARE MOUTH RINSE: 15.0000 mL | OROMUCOSAL | Status: DC

## 2020-04-23 SURGICAL SUPPLY — 44 items
APL PRP STRL LF DISP 70% ISPRP (MISCELLANEOUS)
BLADE CLIPPER SURG (BLADE) IMPLANT
CANISTER SUCT 3000ML PPV (MISCELLANEOUS) ×3 IMPLANT
CANISTER WOUND CARE 500ML ATS (WOUND CARE) ×6 IMPLANT
CATH FOLEY 16FR TEMP PROBE (CATHETERS) ×1 IMPLANT
CHLORAPREP W/TINT 26 (MISCELLANEOUS) ×1 IMPLANT
COVER SURGICAL LIGHT HANDLE (MISCELLANEOUS) ×3 IMPLANT
DRAPE LAPAROSCOPIC ABDOMINAL (DRAPES) ×3 IMPLANT
DRAPE WARM FLUID 44X44 (DRAPES) ×3 IMPLANT
DRSG OPSITE POSTOP 4X10 (GAUZE/BANDAGES/DRESSINGS) IMPLANT
DRSG OPSITE POSTOP 4X8 (GAUZE/BANDAGES/DRESSINGS) IMPLANT
ELECT BLADE 6.5 EXT (BLADE) IMPLANT
ELECT CAUTERY BLADE 6.4 (BLADE) ×3 IMPLANT
ELECT REM PT RETURN 9FT ADLT (ELECTROSURGICAL) ×3
ELECTRODE REM PT RTRN 9FT ADLT (ELECTROSURGICAL) ×2 IMPLANT
GLOVE BIO SURGEON STRL SZ8 (GLOVE) ×3 IMPLANT
GLOVE BIOGEL PI IND STRL 8 (GLOVE) ×2 IMPLANT
GLOVE BIOGEL PI INDICATOR 8 (GLOVE) ×1
GOWN STRL REUS W/ TWL LRG LVL3 (GOWN DISPOSABLE) ×2 IMPLANT
GOWN STRL REUS W/ TWL XL LVL3 (GOWN DISPOSABLE) ×2 IMPLANT
GOWN STRL REUS W/TWL LRG LVL3 (GOWN DISPOSABLE) ×3
GOWN STRL REUS W/TWL XL LVL3 (GOWN DISPOSABLE) ×3
HANDLE SUCTION POOLE (INSTRUMENTS) ×2 IMPLANT
KIT BASIN OR (CUSTOM PROCEDURE TRAY) ×3 IMPLANT
KIT TURNOVER KIT B (KITS) ×3 IMPLANT
LIGASURE IMPACT 36 18CM CVD LR (INSTRUMENTS) IMPLANT
NS IRRIG 1000ML POUR BTL (IV SOLUTION) ×6 IMPLANT
PACK GENERAL/GYN (CUSTOM PROCEDURE TRAY) ×3 IMPLANT
PAD ARMBOARD 7.5X6 YLW CONV (MISCELLANEOUS) ×3 IMPLANT
PENCIL SMOKE EVACUATOR (MISCELLANEOUS) ×3 IMPLANT
SPECIMEN JAR LARGE (MISCELLANEOUS) IMPLANT
SPONGE ABDOMINAL VAC ABTHERA (MISCELLANEOUS) ×1 IMPLANT
SPONGE LAP 18X18 RF (DISPOSABLE) ×10 IMPLANT
STAPLER VISISTAT 35W (STAPLE) ×3 IMPLANT
SUCTION POOLE HANDLE (INSTRUMENTS) ×3
SUT PDS AB 1 TP1 96 (SUTURE) ×4 IMPLANT
SUT SILK 0 FSL (SUTURE) ×8 IMPLANT
SUT SILK 2 0 SH CR/8 (SUTURE) ×4 IMPLANT
SUT SILK 2 0 TIES 10X30 (SUTURE) ×3 IMPLANT
SUT SILK 3 0 SH CR/8 (SUTURE) ×3 IMPLANT
SUT SILK 3 0 TIES 10X30 (SUTURE) ×3 IMPLANT
TOWEL GREEN STERILE (TOWEL DISPOSABLE) ×3 IMPLANT
TRAY FOLEY MTR SLVR 16FR STAT (SET/KITS/TRAYS/PACK) IMPLANT
YANKAUER SUCT BULB TIP NO VENT (SUCTIONS) IMPLANT

## 2020-04-24 ENCOUNTER — Encounter (HOSPITAL_COMMUNITY): Payer: Self-pay | Admitting: General Surgery

## 2020-04-24 ENCOUNTER — Encounter: Payer: Self-pay | Admitting: Family Medicine

## 2020-04-24 LAB — BPAM FFP
Blood Product Expiration Date: 202110142359
Blood Product Expiration Date: 202110142359
Blood Product Expiration Date: 202110142359
Blood Product Expiration Date: 202110142359
Blood Product Expiration Date: 202110142359
Blood Product Expiration Date: 202110142359
Blood Product Expiration Date: 202110142359
Blood Product Expiration Date: 202110142359
Blood Product Expiration Date: 202110142359
Blood Product Expiration Date: 202110142359
Blood Product Expiration Date: 202110142359
Blood Product Expiration Date: 202110142359
Blood Product Expiration Date: 202110142359
Blood Product Expiration Date: 202110142359
Blood Product Expiration Date: 202110162359
Blood Product Expiration Date: 202110162359
Blood Product Expiration Date: 202110162359
Blood Product Expiration Date: 202110162359
Blood Product Expiration Date: 202110162359
Blood Product Expiration Date: 202110162359
Blood Product Expiration Date: 202110162359
Blood Product Expiration Date: 202110162359
Blood Product Expiration Date: 202110162359
Blood Product Expiration Date: 202110162359
Blood Product Expiration Date: 202110162359
Blood Product Expiration Date: 202110162359
Blood Product Expiration Date: 202110162359
Blood Product Expiration Date: 202110162359
Blood Product Expiration Date: 202110162359
Blood Product Expiration Date: 202110192359
Blood Product Expiration Date: 202110212359
Blood Product Expiration Date: 202110302359
Blood Product Expiration Date: 202110302359
Blood Product Expiration Date: 202110302359
Blood Product Expiration Date: 202110302359
Blood Product Expiration Date: 202110302359
Blood Product Expiration Date: 202110302359
ISSUE DATE / TIME: 202110111236
ISSUE DATE / TIME: 202110111236
ISSUE DATE / TIME: 202110111240
ISSUE DATE / TIME: 202110111240
ISSUE DATE / TIME: 202110111240
ISSUE DATE / TIME: 202110111240
ISSUE DATE / TIME: 202110111241
ISSUE DATE / TIME: 202110111241
ISSUE DATE / TIME: 202110111247
ISSUE DATE / TIME: 202110111247
ISSUE DATE / TIME: 202110111247
ISSUE DATE / TIME: 202110111247
ISSUE DATE / TIME: 202110111253
ISSUE DATE / TIME: 202110111253
ISSUE DATE / TIME: 202110111253
ISSUE DATE / TIME: 202110111253
ISSUE DATE / TIME: 202110111300
ISSUE DATE / TIME: 202110111300
ISSUE DATE / TIME: 202110111300
ISSUE DATE / TIME: 202110111300
ISSUE DATE / TIME: 202110111304
ISSUE DATE / TIME: 202110111304
ISSUE DATE / TIME: 202110111322
ISSUE DATE / TIME: 202110111325
ISSUE DATE / TIME: 202110111325
ISSUE DATE / TIME: 202110111338
ISSUE DATE / TIME: 202110111338
ISSUE DATE / TIME: 202110111338
ISSUE DATE / TIME: 202110111338
ISSUE DATE / TIME: 202110111404
ISSUE DATE / TIME: 202110111404
ISSUE DATE / TIME: 202110111404
ISSUE DATE / TIME: 202110111404
ISSUE DATE / TIME: 202110111415
ISSUE DATE / TIME: 202110111415
ISSUE DATE / TIME: 202110111415
ISSUE DATE / TIME: 202110111415
Unit Type and Rh: 5100
Unit Type and Rh: 5100
Unit Type and Rh: 5100
Unit Type and Rh: 5100
Unit Type and Rh: 5100
Unit Type and Rh: 5100
Unit Type and Rh: 5100
Unit Type and Rh: 5100
Unit Type and Rh: 5100
Unit Type and Rh: 5100
Unit Type and Rh: 5100
Unit Type and Rh: 600
Unit Type and Rh: 600
Unit Type and Rh: 6200
Unit Type and Rh: 6200
Unit Type and Rh: 6200
Unit Type and Rh: 6200
Unit Type and Rh: 6200
Unit Type and Rh: 6200
Unit Type and Rh: 6200
Unit Type and Rh: 6200
Unit Type and Rh: 6200
Unit Type and Rh: 6200
Unit Type and Rh: 6200
Unit Type and Rh: 6200
Unit Type and Rh: 6200
Unit Type and Rh: 6200
Unit Type and Rh: 6200
Unit Type and Rh: 6200
Unit Type and Rh: 6200
Unit Type and Rh: 6200
Unit Type and Rh: 9500
Unit Type and Rh: 9500
Unit Type and Rh: 9500
Unit Type and Rh: 9500
Unit Type and Rh: 9500
Unit Type and Rh: 9500

## 2020-04-24 LAB — PREPARE FRESH FROZEN PLASMA
Unit division: 0
Unit division: 0
Unit division: 0
Unit division: 0
Unit division: 0
Unit division: 0
Unit division: 0
Unit division: 0
Unit division: 0
Unit division: 0
Unit division: 0
Unit division: 0
Unit division: 0
Unit division: 0
Unit division: 0
Unit division: 0
Unit division: 0
Unit division: 0
Unit division: 0
Unit division: 0
Unit division: 0
Unit division: 0
Unit division: 0
Unit division: 0
Unit division: 0
Unit division: 0
Unit division: 0
Unit division: 0
Unit division: 0

## 2020-04-24 LAB — TYPE AND SCREEN
ABO/RH(D): O POS
Antibody Screen: NEGATIVE
Unit division: 0
Unit division: 0
Unit division: 0
Unit division: 0
Unit division: 0
Unit division: 0
Unit division: 0
Unit division: 0
Unit division: 0
Unit division: 0
Unit division: 0
Unit division: 0
Unit division: 0
Unit division: 0
Unit division: 0
Unit division: 0
Unit division: 0
Unit division: 0
Unit division: 0
Unit division: 0
Unit division: 0
Unit division: 0
Unit division: 0
Unit division: 0
Unit division: 0
Unit division: 0
Unit division: 0
Unit division: 0
Unit division: 0
Unit division: 0
Unit division: 0
Unit division: 0
Unit division: 0
Unit division: 0
Unit division: 0
Unit division: 0
Unit division: 0
Unit division: 0
Unit division: 0
Unit division: 0
Unit division: 0
Unit division: 0
Unit division: 0
Unit division: 0
Unit division: 0
Unit division: 0
Unit division: 0
Unit division: 0

## 2020-04-24 LAB — BPAM RBC
Blood Product Expiration Date: 202111022359
Blood Product Expiration Date: 202111062359
Blood Product Expiration Date: 202111062359
Blood Product Expiration Date: 202111062359
Blood Product Expiration Date: 202111062359
Blood Product Expiration Date: 202111062359
Blood Product Expiration Date: 202111062359
Blood Product Expiration Date: 202111062359
Blood Product Expiration Date: 202111062359
Blood Product Expiration Date: 202111062359
Blood Product Expiration Date: 202111062359
Blood Product Expiration Date: 202111062359
Blood Product Expiration Date: 202111062359
Blood Product Expiration Date: 202111072359
Blood Product Expiration Date: 202111102359
Blood Product Expiration Date: 202111102359
Blood Product Expiration Date: 202111102359
Blood Product Expiration Date: 202111102359
Blood Product Expiration Date: 202111102359
Blood Product Expiration Date: 202111102359
Blood Product Expiration Date: 202111102359
Blood Product Expiration Date: 202111102359
Blood Product Expiration Date: 202111102359
Blood Product Expiration Date: 202111112359
Blood Product Expiration Date: 202111112359
Blood Product Expiration Date: 202111112359
Blood Product Expiration Date: 202111112359
Blood Product Expiration Date: 202111112359
Blood Product Expiration Date: 202111112359
Blood Product Expiration Date: 202111112359
Blood Product Expiration Date: 202111112359
Blood Product Expiration Date: 202111132359
Blood Product Expiration Date: 202111142359
Blood Product Expiration Date: 202111142359
Blood Product Expiration Date: 202111142359
Blood Product Expiration Date: 202111142359
Blood Product Expiration Date: 202111142359
Blood Product Expiration Date: 202111142359
Blood Product Expiration Date: 202111142359
Blood Product Expiration Date: 202111142359
Blood Product Expiration Date: 202111152359
Blood Product Expiration Date: 202111152359
Blood Product Expiration Date: 202111152359
Blood Product Expiration Date: 202111152359
Blood Product Expiration Date: 202111152359
Blood Product Expiration Date: 202111152359
Blood Product Expiration Date: 202111152359
Blood Product Expiration Date: 202111152359
ISSUE DATE / TIME: 202110111230
ISSUE DATE / TIME: 202110111230
ISSUE DATE / TIME: 202110111240
ISSUE DATE / TIME: 202110111240
ISSUE DATE / TIME: 202110111240
ISSUE DATE / TIME: 202110111240
ISSUE DATE / TIME: 202110111244
ISSUE DATE / TIME: 202110111244
ISSUE DATE / TIME: 202110111248
ISSUE DATE / TIME: 202110111248
ISSUE DATE / TIME: 202110111248
ISSUE DATE / TIME: 202110111248
ISSUE DATE / TIME: 202110111253
ISSUE DATE / TIME: 202110111253
ISSUE DATE / TIME: 202110111253
ISSUE DATE / TIME: 202110111253
ISSUE DATE / TIME: 202110111300
ISSUE DATE / TIME: 202110111300
ISSUE DATE / TIME: 202110111300
ISSUE DATE / TIME: 202110111300
ISSUE DATE / TIME: 202110111304
ISSUE DATE / TIME: 202110111304
ISSUE DATE / TIME: 202110111304
ISSUE DATE / TIME: 202110111304
ISSUE DATE / TIME: 202110111309
ISSUE DATE / TIME: 202110111309
ISSUE DATE / TIME: 202110111309
ISSUE DATE / TIME: 202110111309
ISSUE DATE / TIME: 202110111319
ISSUE DATE / TIME: 202110111319
ISSUE DATE / TIME: 202110111319
ISSUE DATE / TIME: 202110111328
ISSUE DATE / TIME: 202110111328
ISSUE DATE / TIME: 202110111332
ISSUE DATE / TIME: 202110111332
ISSUE DATE / TIME: 202110111332
ISSUE DATE / TIME: 202110111332
ISSUE DATE / TIME: 202110111429
ISSUE DATE / TIME: 202110111429
ISSUE DATE / TIME: 202110111429
ISSUE DATE / TIME: 202110111429
ISSUE DATE / TIME: 202110111528
ISSUE DATE / TIME: 202110111528
ISSUE DATE / TIME: 202110111528
ISSUE DATE / TIME: 202110111528
ISSUE DATE / TIME: 202110111638
ISSUE DATE / TIME: 202110120008
ISSUE DATE / TIME: 202110120828
Unit Type and Rh: 5100
Unit Type and Rh: 5100
Unit Type and Rh: 5100
Unit Type and Rh: 5100
Unit Type and Rh: 5100
Unit Type and Rh: 5100
Unit Type and Rh: 5100
Unit Type and Rh: 5100
Unit Type and Rh: 5100
Unit Type and Rh: 5100
Unit Type and Rh: 5100
Unit Type and Rh: 5100
Unit Type and Rh: 5100
Unit Type and Rh: 5100
Unit Type and Rh: 5100
Unit Type and Rh: 5100
Unit Type and Rh: 5100
Unit Type and Rh: 5100
Unit Type and Rh: 5100
Unit Type and Rh: 5100
Unit Type and Rh: 5100
Unit Type and Rh: 5100
Unit Type and Rh: 5100
Unit Type and Rh: 5100
Unit Type and Rh: 5100
Unit Type and Rh: 5100
Unit Type and Rh: 5100
Unit Type and Rh: 5100
Unit Type and Rh: 5100
Unit Type and Rh: 5100
Unit Type and Rh: 5100
Unit Type and Rh: 5100
Unit Type and Rh: 5100
Unit Type and Rh: 5100
Unit Type and Rh: 5100
Unit Type and Rh: 5100
Unit Type and Rh: 5100
Unit Type and Rh: 5100
Unit Type and Rh: 5100
Unit Type and Rh: 5100
Unit Type and Rh: 5100
Unit Type and Rh: 5100
Unit Type and Rh: 5100
Unit Type and Rh: 5100
Unit Type and Rh: 5100
Unit Type and Rh: 5100
Unit Type and Rh: 5100
Unit Type and Rh: 5100

## 2020-04-24 LAB — BPAM PLATELET PHERESIS
Blood Product Expiration Date: 202110122359
Blood Product Expiration Date: 202110122359
Blood Product Expiration Date: 202110122359
Blood Product Expiration Date: 202110142359
Blood Product Expiration Date: 202110142359
ISSUE DATE / TIME: 202110111257
ISSUE DATE / TIME: 202110111311
ISSUE DATE / TIME: 202110111330
ISSUE DATE / TIME: 202110111424
ISSUE DATE / TIME: 202110111446
Unit Type and Rh: 5100
Unit Type and Rh: 5100
Unit Type and Rh: 6200
Unit Type and Rh: 6200
Unit Type and Rh: 8400

## 2020-04-24 LAB — PREPARE PLATELET PHERESIS
Unit division: 0
Unit division: 0
Unit division: 0
Unit division: 0
Unit division: 0

## 2020-04-24 LAB — PREPARE CRYOPRECIPITATE
Unit division: 0
Unit division: 0
Unit division: 0
Unit division: 0

## 2020-04-24 LAB — BPAM CRYOPRECIPITATE
Blood Product Expiration Date: 202110111855
Blood Product Expiration Date: 202110111924
Blood Product Expiration Date: 202110112027
Blood Product Expiration Date: 202110112140
ISSUE DATE / TIME: 202110111310
ISSUE DATE / TIME: 202110111357
ISSUE DATE / TIME: 202110111548
Unit Type and Rh: 5100
Unit Type and Rh: 5100
Unit Type and Rh: 5100
Unit Type and Rh: 5100

## 2020-04-24 LAB — SURGICAL PATHOLOGY

## 2020-04-24 NOTE — Addendum Note (Signed)
Addendum  created 04/24/20 0818 by Adair Laundry, CRNA   Order list changed

## 2020-05-14 NOTE — ED Notes (Signed)
Intubated 26 at the lip, good color change
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
## 2020-05-14 NOTE — Anesthesia Preprocedure Evaluation (Signed)
Anesthesia Evaluation   Patient unresponsive    Reviewed: Unable to perform ROS - Chart review onlyPreop documentation limited or incomplete due to emergent nature of procedure.  Airway        Dental   Pulmonary           Cardiovascular      Neuro/Psych    GI/Hepatic   Endo/Other    Renal/GU      Musculoskeletal   Abdominal   Peds  Hematology   Anesthesia Other Findings   Reproductive/Obstetrics                             Anesthesia Physical Anesthesia Plan  ASA: V  Anesthesia Plan: General   Post-op Pain Management:    Induction: Intravenous  PONV Risk Score and Plan: 2 and Treatment may vary due to age or medical condition  Airway Management Planned: Oral ETT  Additional Equipment: Arterial line, CVP and Ultrasound Guidance Line Placement  Intra-op Plan:   Post-operative Plan: Post-operative intubation/ventilation  Informed Consent:     Only emergency history available  Plan Discussed with: CRNA and Surgeon  Anesthesia Plan Comments:         Anesthesia Quick Evaluation
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
## 2020-05-14 NOTE — Anesthesia Postprocedure Evaluation (Signed)
Anesthesia Post Note  Patient: Robert Huff  Procedure(s) Performed: EXPLORATORY LAPAROTOMY (N/A Abdomen) SPLENECTOMY (N/A Abdomen) NEPHRECTOMY (Left Abdomen) APPLICATION OF WOUND VAC (N/A Abdomen)     Patient location during evaluation: SICU Anesthesia Type: General Level of consciousness: sedated Pain management: pain level controlled Vital Signs Assessment: vitals unstable Respiratory status: patient remains intubated per anesthesia plan Cardiovascular status: unstable Postop Assessment: no apparent nausea or vomiting Anesthetic complications: no   No complications documented.  Last Vitals:  Vitals:   2020/05/08 1600 05/08/2020 1605  BP:    Pulse:    Resp: 17 13  Temp: (!) 36 C (!) 36 C  SpO2:      Last Pain:  Vitals:   2020-05-08 1247  TempSrc: Temporal                 Edmund Rick
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
## 2020-05-14 NOTE — Anesthesia Procedure Notes (Signed)
Arterial Line Insertion Start/End10/21/2021 1:28 PM, 03-May-2020 1:33 PM Performed by: Val Eagle, MD  Patient location: OR. Preanesthetic checklist: patient identified, IV checked, site marked, risks and benefits discussed, surgical consent, monitors and equipment checked, pre-op evaluation, timeout performed and anesthesia consent Right, radial was placed Catheter size: 20 G Hand hygiene performed   Attempts: 1 Procedure performed without using ultrasound guided technique. Following insertion, dressing applied and Biopatch. Post procedure assessment: normal and unchanged  Patient tolerated the procedure well with no immediate complications.
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
## 2020-05-14 NOTE — Progress Notes (Signed)
Patient ID: Robert Huff, male   DOB: 1960/10/12, 59 y.o.   MRN: [id number] Robert Huff's three brothers and some other family members arrived and I met with them at the bedside. I updated them on his condition which is critical and likely moribund despite our efforts. He received further PRBCs and FFP. Labs pending now. Unfortunately, is is not likely he will survive.  Georganna Skeans, MD, MPH, FACS Please use [email or web address] to contact on call provider
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
## 2020-05-14 NOTE — Progress Notes (Signed)
CDS called with TOD.

## 2020-05-14 NOTE — Procedures (Signed)
Central line  Date/Time: 05/01/2020 3:39 PM Performed by: Violeta Gelinas, MD Authorized by: Violeta Gelinas, MD   Consent:    Consent obtained:  Emergent situation Pre-procedure details:    Hand hygiene: Hand hygiene performed prior to insertion     Sterile barrier technique: All elements of maximal sterile technique followed     Skin preparation:  Hibiclens Anesthesia (see MAR for exact dosages):    Anesthesia method:  None Procedure details:    Location:  R femoral   Patient position:  Flat   Procedural supplies:  Triple lumen   Catheter size:  12 Fr   Landmarks identified: yes     Ultrasound guidance: no     Number of attempts:  1   Successful placement: yes   Post-procedure details:    Post-procedure:  Dressing applied and line sutured   Assessment:  Blood return through all ports   Patient tolerance of procedure:  Tolerated well, no immediate complications

## 2020-05-14 NOTE — Progress Notes (Signed)
Patient ID: Robert Huff, male   DOB: January 16, 1961, 59 y.o.   MRN: [id number] I met with his long-term girlfriend at the bedside.  She reports they have been together for about 30 years.  He has been struggling with depression and has had suicidal ideation.  I outlined his injuries and the massive transfusion he has required.  Despite this, he remains very coagulopathic.  We are transfusing further blood products but I suspect he will not survive despite all of our efforts.  He continues to bleed from his chest tube, his abdominal VAC, as well as his urine.  We will continue blood products for now but if he does not turn around, these efforts may be futile.  She is understanding of this.  Georganna Skeans, MD, MPH, FACS Please use [email or web address] to contact on call provider
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
## 2020-05-14 NOTE — ED Notes (Signed)
Right sided chest tube placed
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
## 2020-05-14 NOTE — Progress Notes (Signed)
RT helped assist with transportation of this pt from ED to OR while on ventilator. Pt tolerated well with SVS. Once pt was in OR, pt was then taken off RT's ventilator and placed on OR's machine.
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
## 2020-05-14 NOTE — ED Notes (Signed)
1 bag of belongings: socks Given to officer [place] badge [phone]

## 2020-05-14 NOTE — Progress Notes (Signed)
Patient ID: Robert Huff, male   DOB: [birth year], 59 y.o.   MRN: [id number] I met with his family again. I discussed code status and they agree it is appropriate to make him DNR.  Georganna Skeans, MD, MPH, FACS Please use [email or web address] to contact on call provider

## 2020-05-14 NOTE — Transfer of Care (Signed)
Immediate Anesthesia Transfer of Care Note  Patient: Robert Huff  Procedure(s) Performed: EXPLORATORY LAPAROTOMY (N/A Abdomen) SPLENECTOMY (N/A Abdomen) NEPHRECTOMY (Left Abdomen) APPLICATION OF WOUND VAC (N/A Abdomen)  Patient Location: ICU  Anesthesia Type:General  Level of Consciousness: Patient remains intubated per anesthesia plan  Airway & Oxygen Therapy: Patient remains intubated per anesthesia plan and Patient placed on Ventilator (see vital sign flow sheet for setting)  Post-op Assessment: Post -op Vital signs reviewed and unstable, Anesthesiologist notified  Post vital signs: Reviewed and unstable  Last Vitals:  Vitals Value Taken Time  BP    Temp 36 C May 02, 2020 1606  Pulse 113 May 02, 2020 1555  Resp 15 May 02, 2020 1606  SpO2 93 % May 02, 2020 1555  Vitals shown include unvalidated device data.  Last Pain:  Vitals:   May 02, 2020 1247  TempSrc: Temporal         Complications: No complications documented.
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
## 2020-05-14 NOTE — Progress Notes (Signed)
Pt's time of death 16. No breath or heart sounds auscultated. Lots of family and friends at the bedside.

## 2020-05-14 NOTE — ED Notes (Signed)
MTP activated

## 2020-05-14 DEATH — deceased

## 2020-07-22 IMAGING — DX DG KNEE AP/LAT W/ SUNRISE*L*
3 series · 3 of 3 positions shown · non-contrast
Comparison: No recent.

CLINICAL DATA: Chronic left knee pain.

EXAM:
LEFT KNEE 3 VIEWS

[dg knee ap/lat w/ sunrise left (1 of 3)]
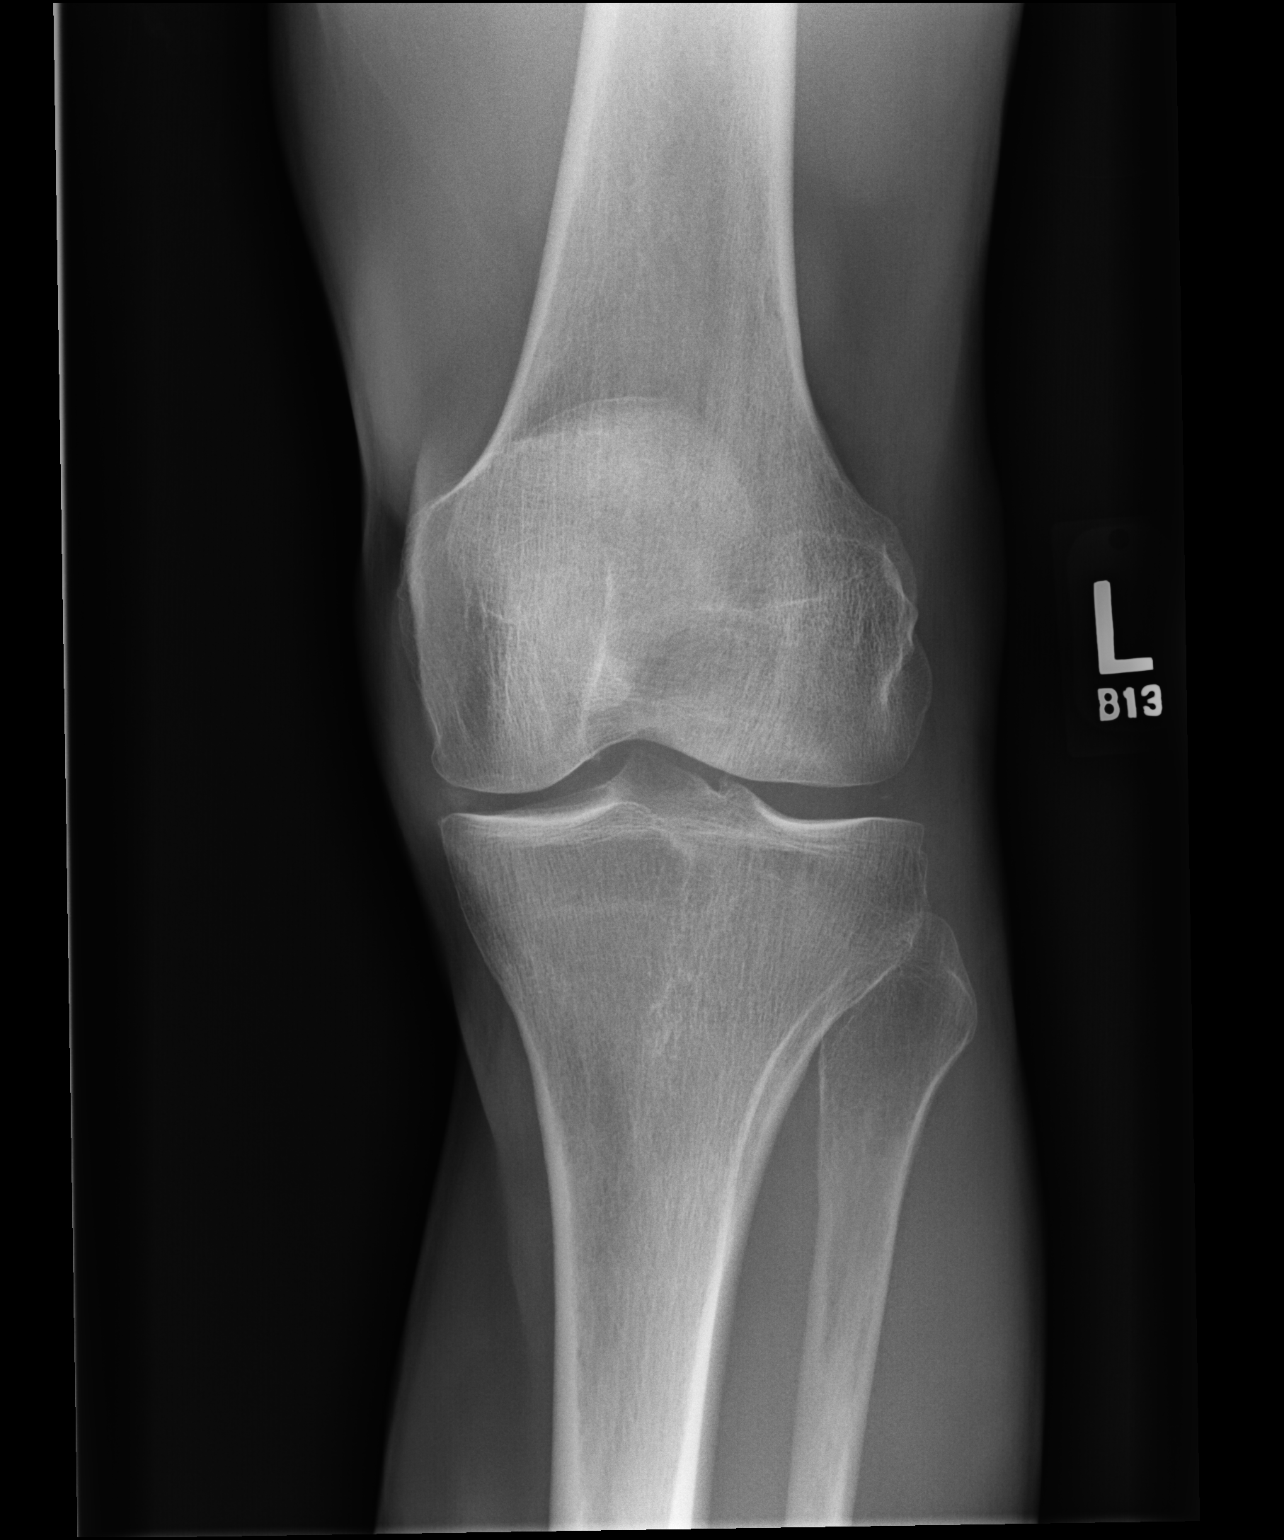

[dg knee ap/lat w/ sunrise left (2 of 3)]
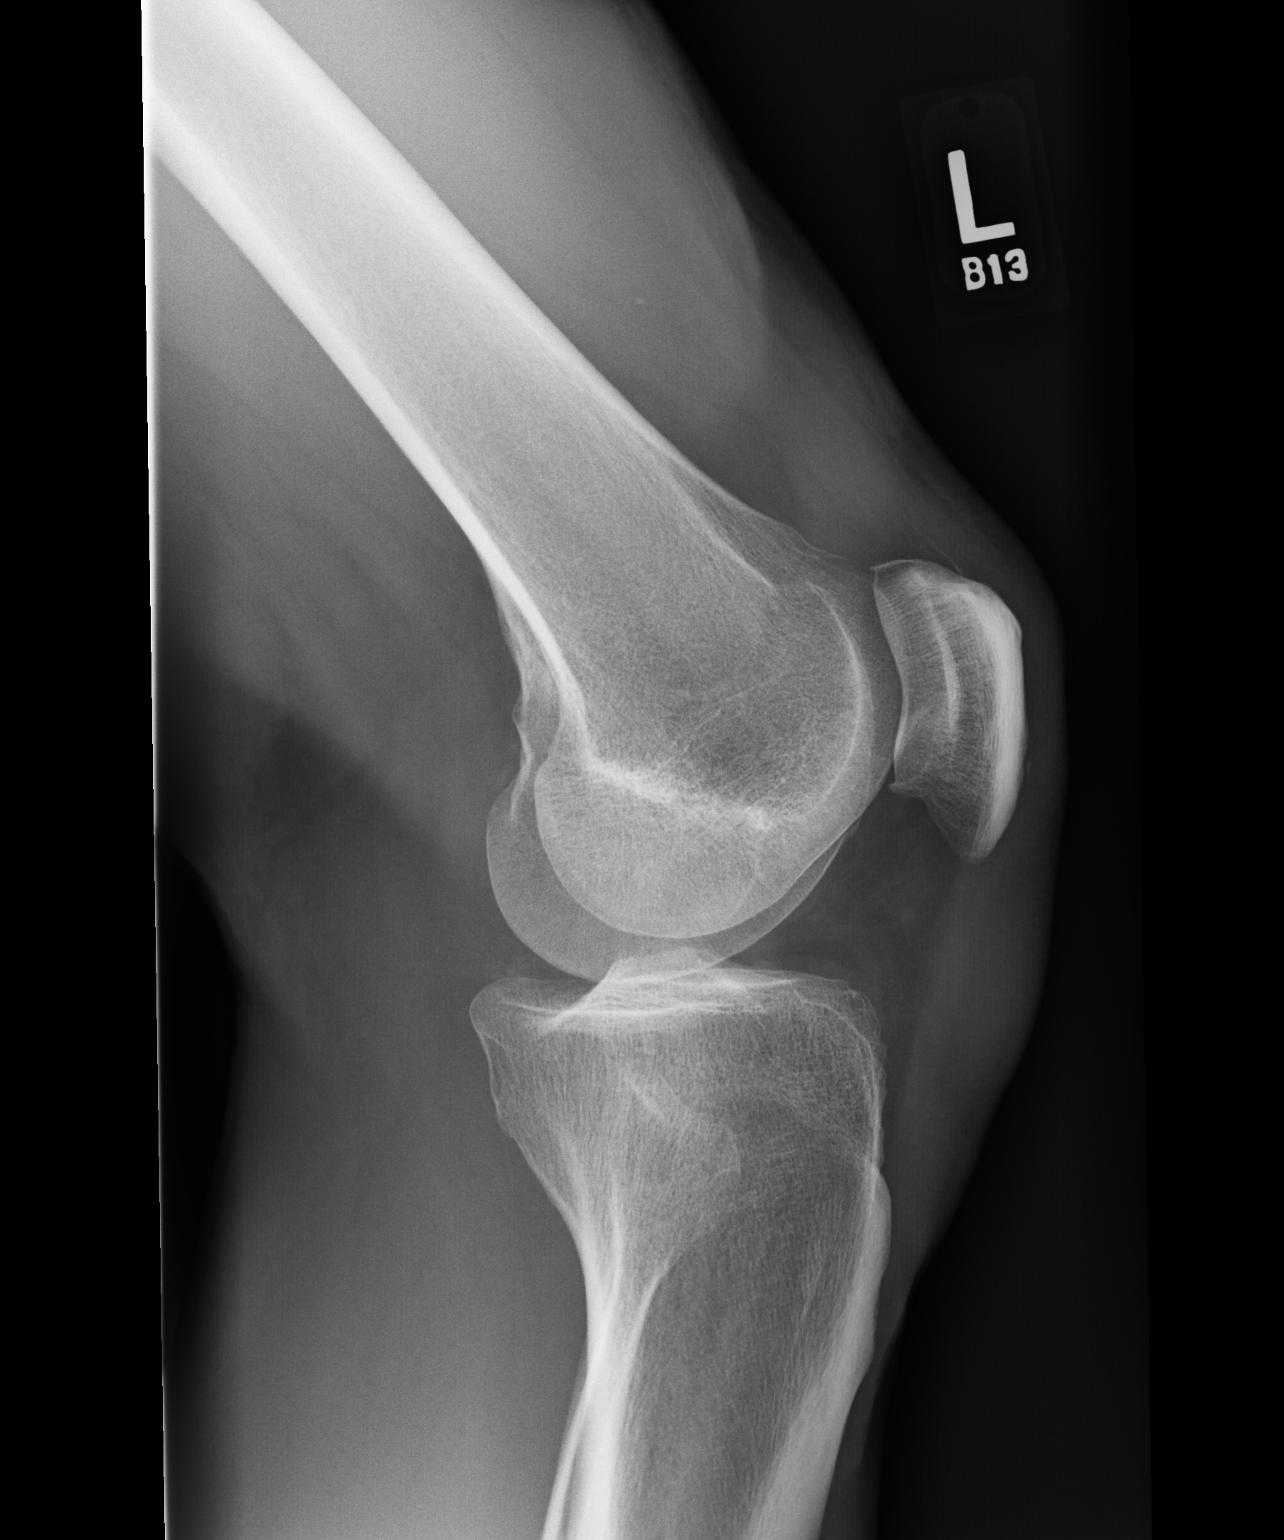

[dg knee ap/lat w/ sunrise left (3 of 3)]
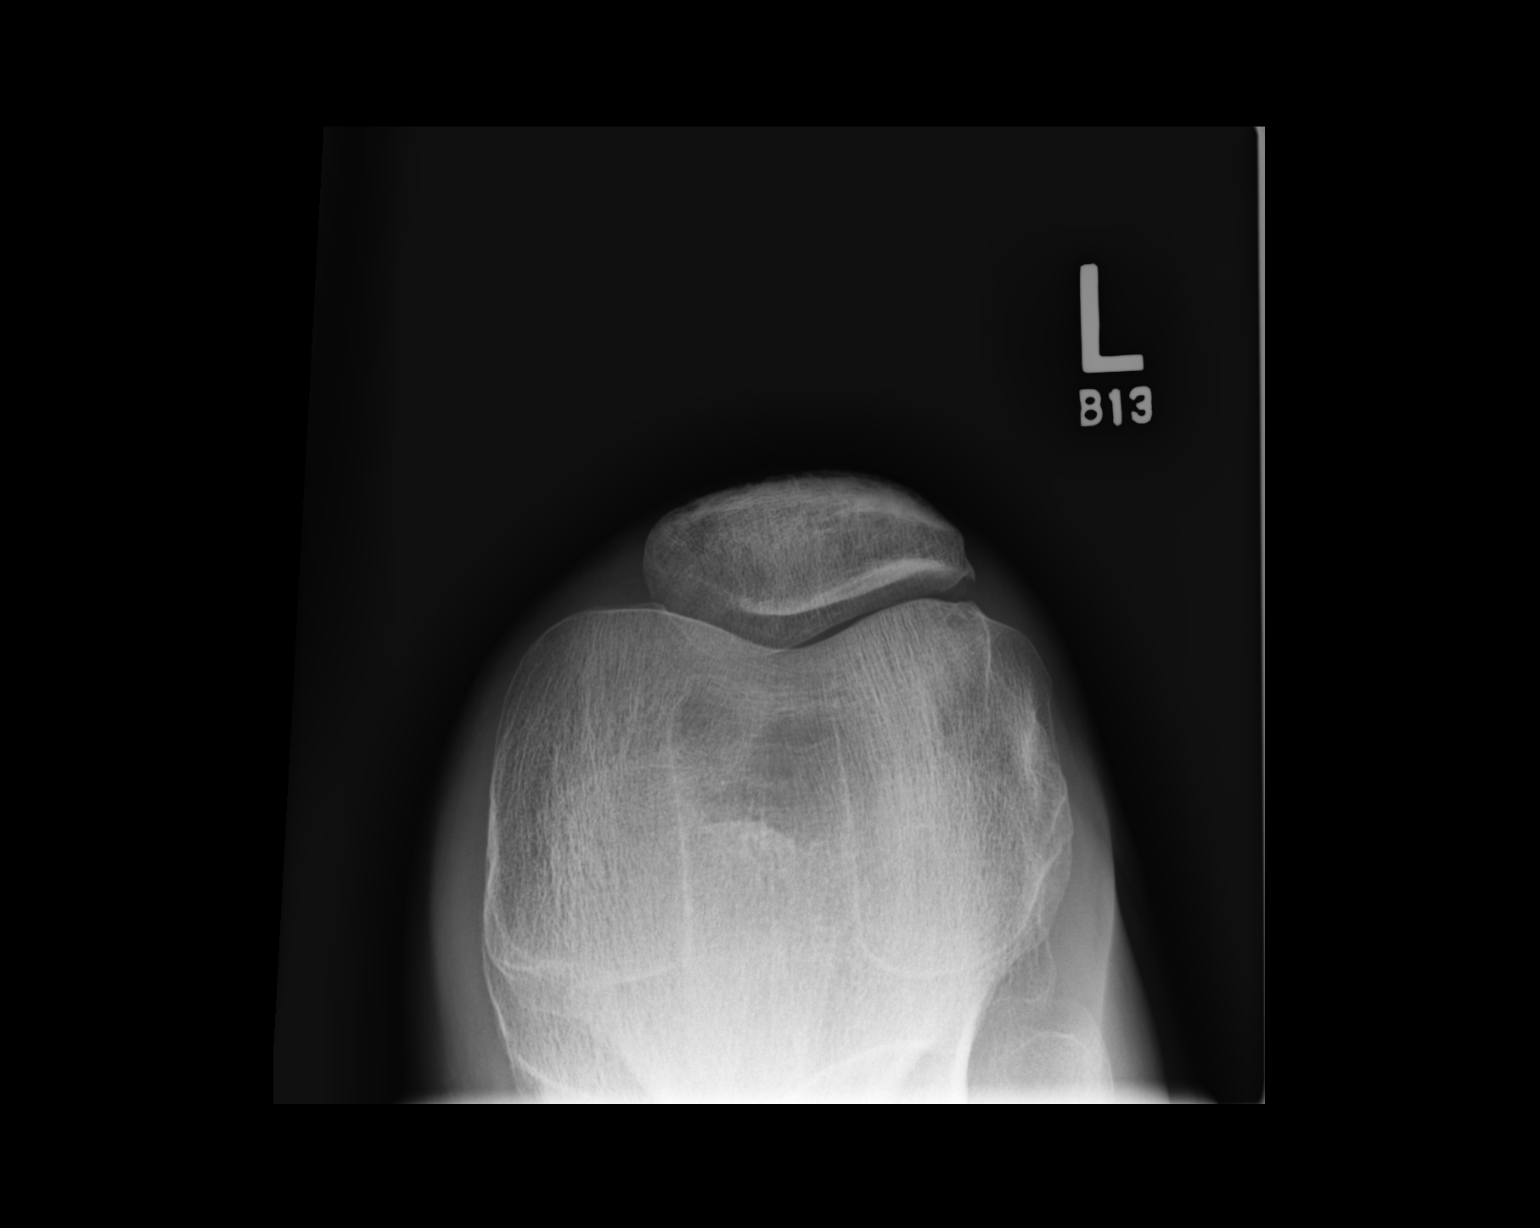

[3 of 3 positions shown; findings below may reference images not displayed]

FINDINGS: No acute bony or joint abnormality identified. Mild patellofemoral
and medial compartment degenerative change. Tiny loose bodies may be
present. Patellar tendon prominence. Patellar tendinosis could
present this fashion.
IMPRESSION: 1. No acute abnormality. Mild patellofemoral and medial compartment
degenerative change. Tiny loose bodies may be present. No acute bony
abnormality.

2. Patellar tendon prominence. Patellar tendinosis could present in
this fashion.
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
